# Patient Record
Sex: Female | Born: 1950 | Race: Black or African American | Hispanic: No | Marital: Married | Smoking: Never smoker
Health system: Southern US, Community
[De-identification: ages and names within clinical notes are randomized; demographics above are authoritative.]

## PROBLEM LIST (undated history)

## (undated) DIAGNOSIS — E7800 Pure hypercholesterolemia, unspecified: Secondary | ICD-10-CM

## (undated) DIAGNOSIS — I10 Essential (primary) hypertension: Secondary | ICD-10-CM

## (undated) DIAGNOSIS — R7303 Prediabetes: Secondary | ICD-10-CM

## (undated) HISTORY — PX: OTHER SURGICAL HISTORY: SHX169

---

## 2014-07-15 ENCOUNTER — Encounter: Attending: Family Medicine | Primary: Family Medicine

## 2014-07-27 ENCOUNTER — Encounter: Attending: Family Medicine | Primary: Family Medicine

## 2014-08-05 ENCOUNTER — Ambulatory Visit
Admit: 2014-08-05 | Discharge: 2014-08-05 | Payer: PRIVATE HEALTH INSURANCE | Attending: Family Medicine | Primary: Family Medicine

## 2014-08-05 DIAGNOSIS — I10 Essential (primary) hypertension: Secondary | ICD-10-CM

## 2014-08-05 MED ORDER — LISINOPRIL 5 MG TAB
5 mg | ORAL_TABLET | Freq: Every day | ORAL | Status: DC
Start: 2014-08-05 — End: 2014-10-19

## 2014-08-05 MED ORDER — TRIAMCINOLONE ACETONIDE 0.1 % TOPICAL CREAM
0.1 % | Freq: Two times a day (BID) | CUTANEOUS | Status: AC
Start: 2014-08-05 — End: ?

## 2014-08-05 MED ORDER — ATENOLOL 25 MG TAB
25 mg | ORAL_TABLET | Freq: Every day | ORAL | Status: DC
Start: 2014-08-05 — End: 2014-10-19

## 2014-08-05 NOTE — Progress Notes (Signed)
1. Have you been to the ER, urgent care clinic since your last visit?  Hospitalized since your last visit?No    2. Have you seen or consulted any other health care providers outside of the [facility] since your last visit?  Include any pap smears or colon screening. No    Patient id verified x 3.     Chief Complaint   Patient presents with   ??? New Patient     est pcp    ??? Head Pain     [facility] and [facility] were going [facility] results but nothing yet    ??? Medication Refill     patient needs refill on bp medication and having spiked bp    ??? Skin Problem     patient reporting right hand itchs and burns. per patient burning is worse when touching water.

## 2014-08-05 NOTE — Progress Notes (Signed)
1. Have you been to the ER, urgent care clinic since your last visit?  Hospitalized since your last visit?No    2. Have you seen or consulted any other health care providers outside of the [facility] since your last visit?  Include any pap smears or colon screening. No    Patient id verified x 3.     Chief Complaint   Patient presents with   ??? New Patient     est pcp    ??? Head Pain     [facility] results but nothing yet    ??? Medication Refill     patient needs refill on bp medication and having spiked bp    ??? Skin Problem     patient reporting right hand itchs and burns. per patient burning is worse when touching water.        Chief Complaint   Patient presents with   ??? New Patient     est pcp    ??? Head Pain     [facility] but nothing yet    ??? Medication Refill     patient needs refill on bp medication and having spiked bp    ??? Skin Problem     patient reporting right hand itchs and burns. per patient burning is worse when touching water.      she is a 64 y.o. year old female who presents for evalution.    Reviewed PmHx, RxHx, FmHx, SocHx, AllgHx and updated and dated in the chart.    Nurse notes were reviewed and copied in this note.    Patient Active Problem List    Diagnosis   ??? Essential hypertension       Nurse notes were reviewed and copied and are correct  Review of Systems - negative except as listed above in the HPI    Objective:     Filed Vitals:    08/05/14 1010   BP: 127/81   Pulse: 50   Temp: 98.1 ??F (36.7 ??C)   TempSrc: Oral   Resp: 18   Weight: 147 lb (66.679 kg)   SpO2: 98%     Physical Examination: General appearance - alert, well appearing, and in no distress  Eyes - pupils equal and reactive, extraocular eye movements intact  Ears - bilateral TM's and external ear canals normal  Nose - normal and patent, no erythema, discharge or polyps   Mouth - mucous membranes moist, pharynx normal without lesions  Neck - supple, no significant adenopathy  Chest - clear to auscultation, no wheezes, rales or rhonchi, symmetric air entry  Heart - normal rate, regular rhythm, normal S1, S2, no murmurs, rubs, clicks or gallops  Abdomen - soft, nontender, nondistended, no masses or organomegaly  Extremities - R hand with red dry skin, no dc, no cracks, nt        Assessment/ Plan:   Ainsleigh was seen today for new patient, head pain, medication refill and skin problem.    Diagnoses and all orders for this visit:    Essential hypertension  Orders:  -     lisinopril (PRINIVIL, ZESTRIL) 5 mg tablet; Take 1 Tab by mouth daily.  -     atenolol (TENORMIN) 25 mg tablet; Take 1 Tab by mouth daily.  -at goal    Rash  Orders:  -     triamcinolone  acetonide (KENALOG) 0.1 % topical cream; Apply  to affected area two (2) times a day.  -add rx  -dec water contact      Other complicated headache syndrome  -will look for CT scan from [facility]       Follow-up Disposition:  Return in about 6 months (around 02/04/2015).    I have discussed the diagnosis with the patient and the intended plan as seen in the above orders.  The patient understands and agrees with the plan. The patient has received an after-visit summary and questions were answered concerning future plans.     Medication Side Effects and Warnings were discussed with patient: yes  Patient Labs were reviewed and or requested: yes    Patient Past Records were reviewed and or requested: yes    Danley Danker, M.D.    There are no Patient Instructions on file for this visit.

## 2014-10-19 ENCOUNTER — Ambulatory Visit
Admit: 2014-10-19 | Discharge: 2014-10-19 | Payer: PRIVATE HEALTH INSURANCE | Attending: Family Medicine | Primary: Family Medicine

## 2014-10-19 DIAGNOSIS — I10 Essential (primary) hypertension: Secondary | ICD-10-CM

## 2014-10-19 MED ORDER — ATENOLOL 25 MG TAB
25 mg | ORAL_TABLET | Freq: Every day | ORAL | Status: DC
Start: 2014-10-19 — End: 2014-12-08

## 2014-10-19 MED ORDER — LISINOPRIL 5 MG TAB
5 mg | ORAL_TABLET | Freq: Every day | ORAL | Status: DC
Start: 2014-10-19 — End: 2014-12-08

## 2014-10-19 NOTE — Progress Notes (Signed)
1. Have you been to the ER, urgent care clinic since your last visit?  Hospitalized since your last visit?No    2. Have you seen or consulted any other health care providers outside of the [facility] since your last visit?  Include any pap smears or colon screening. No    Patient id verified x 3.     Chief Complaint   Patient presents with   ??? Ear Pain     pain and itching to both ears since 3 weeks ago    ??? Medication Refill   ??? Results     needs to [facility] at [facility] [place].    ??? Labs     patient had eyes checked and per eye doctor her pcp should address cholesterol.

## 2014-10-19 NOTE — Progress Notes (Signed)
1. Have you been to the ER, urgent care clinic since your last visit?  Hospitalized since your last visit?No    2. Have you seen or consulted any other health care providers outside of the [facility] since your last visit?  Include any pap smears or colon screening. No    Patient id verified x 3.     Chief Complaint   Patient presents with   ??? Ear Pain     pain and itching to both ears since 3 weeks ago    ??? Medication Refill   ??? Results     needs to [facility] at [facility] [place].    ??? Labs     patient had eyes checked and per eye doctor her pcp should address cholesterol.      Chief Complaint   Patient presents with   ??? Ear Pain     pain and itching to both ears since 3 weeks ago    ??? Medication Refill   ??? Results     needs to [facility] at [facility] [place].    ??? Labs     patient had eyes checked and per eye doctor her pcp should address cholesterol.      she is a 64 y.o. year old female who presents for evalution.    Reviewed PmHx, RxHx, FmHx, SocHx, AllgHx and updated and dated in the chart.    Nurse notes were reviewed and copied in this note.    Patient Active Problem List    Diagnosis   ??? Essential hypertension       Nurse notes were reviewed and copied and are correct  Review of Systems - negative except as listed above in the HPI    Objective:     Filed Vitals:    10/19/14 1031   BP: 157/88   Pulse: 56   Temp: 98 ??F (36.7 ??C)   TempSrc: Oral   Resp: 16   Height: 5\' 3"  (1.6 m)   Weight: 151 lb (68.493 kg)   SpO2: 98%     Physical Examination: General appearance - alert, well appearing, and in no distress  Eyes - pupils equal and reactive, extraocular eye movements intact  Ears - bilateral TM's and external ear canals normal  Nose - normal and patent, no erythema, discharge or polyps  Neck - supple, no significant adenopathy  Chest - clear to auscultation, no wheezes, rales or rhonchi, symmetric air entry  Heart - normal rate, regular rhythm, normal S1, S2, no murmurs, rubs,  clicks or gallops    Assessment/ Plan:   Javionna was seen today for ear pain, medication refill, results and labs.    Diagnoses and all orders for this visit:    Essential hypertension with goal blood pressure less than 140/90  Orders:  -     lisinopril (PRINIVIL, ZESTRIL) 5 mg tablet; Take 1 Tab by mouth daily.  -     atenolol (TENORMIN) 25 mg tablet; Take 1 Tab by mouth daily.  -     LIPID PANEL  -     METABOLIC PANEL, COMPREHENSIVE  -better at home       Follow-up Disposition:  Return in about 6 months (around 04/20/2015).    I have discussed the diagnosis with the patient and the intended plan as seen in the above orders.  The patient understands and agrees with the plan. The patient has received an after-visit summary and questions  were answered concerning future plans.     Medication Side Effects and Warnings were discussed with patient: yes  Patient Labs were reviewed and or requested: yes  Patient Past Records were reviewed and or requested: yes    Cheri Rous, M.D.    There are no Patient Instructions on file for this visit.

## 2014-10-20 LAB — METABOLIC PANEL, COMPREHENSIVE
A-G Ratio: 1.5 (ref 1.1–2.5)
ALT (SGPT): 10 IU/L (ref 0–32)
AST (SGOT): 13 IU/L (ref 0–40)
Albumin: 4.2 g/dL (ref 3.6–4.8)
Alk. phosphatase: 113 IU/L (ref 39–117)
BUN/Creatinine ratio: 20 (ref 11–26)
BUN: 13 mg/dL (ref 8–27)
Bilirubin, total: 0.4 mg/dL (ref 0.0–1.2)
CO2: 27 mmol/L (ref 18–29)
Calcium: 9.3 mg/dL (ref 8.7–10.3)
Chloride: 105 mmol/L (ref 97–108)
Creatinine: 0.64 mg/dL (ref 0.57–1.00)
GFR est AA: 110 mL/min/{1.73_m2} (ref >59)
GFR est non-AA: 95 mL/min/{1.73_m2} (ref >59)
GLOBULIN, TOTAL: 2.8 g/dL (ref 1.5–4.5)
Glucose: 85 mg/dL (ref 65–99)
Potassium: 4.3 mmol/L (ref 3.5–5.2)
Protein, total: 7 g/dL (ref 6.0–8.5)
Sodium: 145 mmol/L — ABNORMAL HIGH (ref 134–144)

## 2014-10-20 LAB — LIPID PANEL
Cholesterol, total: 168 mg/dL (ref 100–199)
HDL Cholesterol: 33 mg/dL — ABNORMAL LOW (ref >39)
LDL, calculated: 121 mg/dL — ABNORMAL HIGH (ref 0–99)
Triglyceride: 70 mg/dL (ref 0–149)
VLDL, calculated: 14 mg/dL (ref 5–40)

## 2014-10-20 LAB — CVD REPORT

## 2014-10-20 NOTE — Progress Notes (Signed)
Quick Note:        Your labs look good! I suggest we continue with your current plan of treatment and follow-up as we have discussed. If you have any questions, do not hesitate to send me a message.        New for 2016...you will be receiving a patient satisfaction survey from [place] and our office. I appreciate if you would take a few moments to complete the form. A score of 5 is what we strive for, and while we are not perfect...we are always working our hardest for you!!            Dr. Fadia Marlar, M.D.            Good Help to those in Need!!!                ______

## 2014-10-20 NOTE — Progress Notes (Signed)
Quick Note:        Letter sent        ______

## 2014-11-15 NOTE — Progress Notes (Signed)
Pt stats she wants her cd from [facility]. Let her know she has a wk to pick up.

## 2014-12-08 ENCOUNTER — Encounter

## 2014-12-08 MED ORDER — LISINOPRIL 5 MG TAB
5 mg | ORAL_TABLET | Freq: Every day | ORAL | Status: AC
Start: 2014-12-08 — End: ?

## 2014-12-08 MED ORDER — ATENOLOL 25 MG TAB
25 mg | ORAL_TABLET | Freq: Every day | ORAL | Status: AC
Start: 2014-12-08 — End: ?

## 2014-12-20 NOTE — Progress Notes (Signed)
Pt had a week to pick up disc from 07.18.16 and never picked going to shred disc. 08.22.16

## 2020-05-11 ENCOUNTER — Other Ambulatory Visit: Payer: Self-pay

## 2020-05-11 ENCOUNTER — Other Ambulatory Visit (HOSPITAL_COMMUNITY)
Admission: RE | Admit: 2020-05-11 | Discharge: 2020-05-11 | Disposition: A | Discharge status: Home / Self Care | Payer: Medicare Other | Source: Ambulatory Visit | Attending: Obstetrics & Gynecology | Admitting: Obstetrics & Gynecology

## 2020-05-11 ENCOUNTER — Encounter: Payer: Self-pay | Admitting: Obstetrics & Gynecology

## 2020-05-11 ENCOUNTER — Ambulatory Visit (INDEPENDENT_AMBULATORY_CARE_PROVIDER_SITE_OTHER): Payer: Medicare Other | Admitting: Obstetrics & Gynecology

## 2020-05-11 VITALS — BP 128/68 | HR 71 | Ht 64.0 in | Wt 167.0 lb

## 2020-05-11 DIAGNOSIS — Z1231 Encounter for screening mammogram for malignant neoplasm of breast: Secondary | ICD-10-CM | POA: Diagnosis not present

## 2020-05-11 DIAGNOSIS — Z01419 Encounter for gynecological examination (general) (routine) without abnormal findings: Secondary | ICD-10-CM | POA: Diagnosis not present

## 2020-05-11 DIAGNOSIS — Z1151 Encounter for screening for human papillomavirus (HPV): Secondary | ICD-10-CM | POA: Diagnosis not present

## 2020-05-11 NOTE — Progress Notes (Signed)
Subjective:     Destiny Cannon is a 70 y.o. female here for a routine exam.  Current complaints: Pt has a family history of breast cancer in 2 sisters in their 76's. She is faithful in getting her mammograms annually. She denies current GYN problems but, she wants to make sure that she gets a breast exam. Pt is s/p menopause 15 years prev. She is s/p SVD x4.  Her daughter is a pt of ours and referred her to our ofc.      Gynecologic History No LMP recorded. Patient is postmenopausal. Contraception: post menopausal status Last Pap: unk Last mammogram: 07/07/2019. Results were: [facility] at [facility].   Obstetric History OB History  Gravida Para Term Preterm AB Living  4 4 3     4   SAB IAB Ectopic Multiple Live Births          4    # Outcome Date GA Lbr Len/2nd Weight Sex Delivery Anes PTL Lv  4 Term 35 [redacted]w[redacted]d   M Vag-Spont None N LIV  3 Para 1995 [redacted]w[redacted]d   F Vag-Spont None N LIV  2 Term 73 [redacted]w[redacted]d   M Vag-Spont None Y LIV  1 Term 1977 [redacted]w[redacted]d   M Vag-Spont None N LIV   The following portions of the patient's history were reviewed and updated as appropriate: allergies, current medications, past family history, past medical history, past social history, past surgical history and problem list.  Review of Systems Pertinent items are noted in HPI.    Objective:  BP 128/68   Pulse 71   Ht 5\' 4"  (1.626 m)   Wt 167 lb (75.8 kg)   BMI 28.67 kg/m  General Appearance:    Alert, cooperative, no distress, appears stated age  Head:    Normocephalic, without obvious abnormality, atraumatic  Eyes:    conjunctiva/corneas clear, EOM's intact, both eyes  Ears:    Normal external ear canals, both ears  Nose:   Nares normal, septum midline, mucosa normal, no drainage    or sinus tenderness  Throat:   Lips, mucosa, and tongue normal; teeth and gums normal  Neck:   Supple, symmetrical, trachea midline, no adenopathy;    thyroid:  no enlargement/tenderness/nodules  Back:     Symmetric, no curvature, ROM  normal, no CVA tenderness  Lungs:     respirations unlabored  Chest Wall:    No tenderness or deformity   Heart:    Regular rate and rhythm  Breast Exam:    No tenderness, masses, or nipple abnormality. Well healed incision right breast. NO axillary masses palpated.    Abdomen:     Soft, non-tender, bowel sounds active all four quadrants,    no masses, no organomegaly  Genitalia:    Normal female without lesion, discharge or tenderness     Extremities:   Extremities normal, atraumatic, no cyanosis or edema  Pulses:   2+ and symmetric all extremities  Skin:   Skin color, texture, turgor normal, no rashes or lesions    Assessment:    Healthy female exam.   Family history of breast cancer. Pt has not had genetic testing. Does not feel its needed.  Pt UTD on BMD and colonoscopy     Plan:    f/u Mammogram in Mar F/u in 1 year for annual  F/u for PAP with hrHPV  Destiny Cannon L. Harraway-Smith, M.D., 

## 2020-05-13 LAB — CYTOLOGY - PAP
Comment: NEGATIVE
Diagnosis: NEGATIVE
High risk HPV: NEGATIVE

## 2020-05-17 ENCOUNTER — Emergency Department (HOSPITAL_BASED_OUTPATIENT_CLINIC_OR_DEPARTMENT_OTHER): Payer: Medicare Other

## 2020-05-17 ENCOUNTER — Encounter (HOSPITAL_BASED_OUTPATIENT_CLINIC_OR_DEPARTMENT_OTHER): Payer: Self-pay

## 2020-05-17 ENCOUNTER — Other Ambulatory Visit: Payer: Self-pay

## 2020-05-17 ENCOUNTER — Emergency Department (HOSPITAL_BASED_OUTPATIENT_CLINIC_OR_DEPARTMENT_OTHER)
Admission: EM | Admit: 2020-05-17 | Discharge: 2020-05-17 | Disposition: A | Discharge status: Home / Self Care | Payer: Medicare Other | Attending: Emergency Medicine | Admitting: Emergency Medicine

## 2020-05-17 DIAGNOSIS — U071 COVID-19: Secondary | ICD-10-CM

## 2020-05-17 DIAGNOSIS — N3000 Acute cystitis without hematuria: Secondary | ICD-10-CM

## 2020-05-17 DIAGNOSIS — Z79899 Other long term (current) drug therapy: Secondary | ICD-10-CM | POA: Insufficient documentation

## 2020-05-17 DIAGNOSIS — R109 Unspecified abdominal pain: Secondary | ICD-10-CM | POA: Diagnosis present

## 2020-05-17 DIAGNOSIS — I10 Essential (primary) hypertension: Secondary | ICD-10-CM | POA: Diagnosis not present

## 2020-05-17 HISTORY — DX: Pure hypercholesterolemia, unspecified: E78.00

## 2020-05-17 HISTORY — DX: Essential (primary) hypertension: I10

## 2020-05-17 LAB — URINALYSIS, MICROSCOPIC (REFLEX): WBC, UA: >50 WBC/hpf (ref 0–5)

## 2020-05-17 LAB — CBC
HCT: 44.2 % (ref 36.0–46.0)
Hemoglobin: 14.6 g/dL (ref 12.0–15.0)
MCH: 29.5 pg (ref 26.0–34.0)
MCHC: 33 g/dL (ref 30.0–36.0)
MCV: 89.3 fL (ref 80.0–100.0)
Platelets: 246 10*3/uL (ref 150–400)
RBC: 4.95 MIL/uL (ref 3.87–5.11)
RDW: 14 % (ref 11.5–15.5)
WBC: 9.6 10*3/uL (ref 4.0–10.5)
nRBC: 0 % (ref 0.0–0.2)

## 2020-05-17 LAB — BASIC METABOLIC PANEL
Anion gap: 11 (ref 5–15)
BUN: 12 mg/dL (ref 8–23)
CO2: 27 mmol/L (ref 22–32)
Calcium: 9.1 mg/dL (ref 8.9–10.3)
Chloride: 97 mmol/L — ABNORMAL LOW (ref 98–111)
Creatinine, Ser: 0.69 mg/dL (ref 0.44–1.00)
GFR, Estimated: >60 mL/min (ref >60)
Glucose, Bld: 115 mg/dL — ABNORMAL HIGH (ref 70–99)
Potassium: 3.8 mmol/L (ref 3.5–5.1)
Sodium: 135 mmol/L (ref 135–145)

## 2020-05-17 LAB — URINALYSIS, ROUTINE W REFLEX MICROSCOPIC
Bilirubin Urine: NEGATIVE
Glucose, UA: NEGATIVE mg/dL
Ketones, ur: NEGATIVE mg/dL
Nitrite: NEGATIVE
Protein, ur: NEGATIVE mg/dL
Specific Gravity, Urine: 1.01 (ref 1.005–1.030)
pH: 7 (ref 5.0–8.0)

## 2020-05-17 MED ORDER — SODIUM CHLORIDE 0.9 % IV SOLN
1.0000 g | Freq: Once | INTRAVENOUS | Status: AC
  Administered 2020-05-17: 1 g via INTRAVENOUS
  Filled 2020-05-17: qty 10

## 2020-05-17 MED ORDER — CEPHALEXIN 500 MG PO CAPS: 500.0000 mg | ORAL_CAPSULE | Freq: Four times a day (QID) | ORAL | 0 refills | Status: DC

## 2020-05-17 NOTE — ED Triage Notes (Signed)
Pt c/o bilat flank pain started 1/13-also dx with +covid 1/13-NAD-steady gait

## 2020-05-17 NOTE — Discharge Instructions (Signed)
Starting tomorrow take the antibiotic Keflex as directed for the next 7 days.  Work-up seems to be consistent with urinary tract infection.  IV antibiotics given here tonight.  Would expect you to improve over the next 2 days.  Follow-up with your doctor or return here for any new or worse symptoms.

## 2020-05-17 NOTE — ED Provider Notes (Signed)
[facility] EMERGENCY DEPARTMENT Provider Note   CSN: [id number] Arrival date & time: 05/17/20  1422     History Chief Complaint  Patient presents with  . Flank Pain    +covid    Destiny Cannon is a 70 y.o. female.  Patient with a complaint of bilateral flank pain since January 13.  She also was diagnosed with COVID at that time.  No real classic COVID symptoms no cough no body aches no shortness of breath.  It does hurt occasionally to pee.  No nausea vomiting or diarrhea no fevers.  Patient is fully vaccinated.  Along with booster.  Past medical history significant for hypertension high cholesterol.        Past Medical History:  Diagnosis Date  . High cholesterol   . Hypertension     There are no problems to display for this patient.   Past Surgical History:  Procedure Laterality Date  . right breast biopsy        OB History    Gravida  4   Para  4   Term  3   Preterm      AB      Living  4     SAB      IAB      Ectopic      Multiple      Live Births  4           Family History  Problem Relation Age of Onset  . Colon cancer Mother     Social History   Tobacco Use  . Smoking status: Never Smoker  . Smokeless tobacco: Never Used  Substance Use Topics  . Alcohol use: Never  . Drug use: Never    Home Medications Prior to Admission medications   Medication Sig Start Date End Date Taking? Authorizing Provider  cephALEXin (KEFLEX) 500 MG capsule Take 1 capsule (500 mg total) by mouth 4 (four) times daily. 05/17/20  Yes Vanetta Mulders, MD  Ascorbic Acid (VITAMIN C WITH ROSE HIPS) 500 MG tablet Take 500 mg by mouth daily.    [provider]  aspirin EC 81 MG tablet Take 81 mg by mouth daily. Swallow whole.    [provider]  atorvastatin (LIPITOR) 10 MG tablet Take 10 mg by mouth daily.    [provider]  cholecalciferol (VITAMIN D3) 25 MCG (1000 UNIT) tablet Take 1,000 Units by mouth daily.     [provider]  lisinopril-hydrochlorothiazide (ZESTORETIC) 20-12.5 MG tablet Take 1 tablet by mouth daily.    [provider]    Allergies    Patient has no known allergies.  Review of Systems   Review of Systems  Constitutional: Negative for chills and fever.  HENT: Negative for congestion, rhinorrhea and sore throat.   Eyes: Negative for visual disturbance.  Respiratory: Negative for cough and shortness of breath.   Cardiovascular: Negative for chest pain and leg swelling.  Gastrointestinal: Negative for abdominal pain, diarrhea, nausea and vomiting.  Genitourinary: Positive for dysuria and flank pain.  Musculoskeletal: Negative for back pain and neck pain.  Skin: Negative for rash.  Neurological: Negative for dizziness, light-headedness and headaches.  Hematological: Does not bruise/bleed easily.  Psychiatric/Behavioral: Negative for confusion.    Physical Exam Updated Vital Signs BP (!) 141/107 (BP Location: Right Arm)   Pulse (!) 55   Temp 98.5 F (36.9 C) (Oral)   Resp 14   Ht 1.6 m (5\' 3" )   Wt 71.7  kg   SpO2 100%   BMI 27.99 kg/m   Physical Exam Vitals and nursing note reviewed.  Constitutional:      General: She is not in acute distress.    Appearance: Normal appearance. She is well-developed and well-nourished. She is not ill-appearing.  HENT:     Head: Normocephalic and atraumatic.  Eyes:     Extraocular Movements: Extraocular movements intact.     Conjunctiva/sclera: Conjunctivae normal.     Pupils: Pupils are equal, round, and reactive to light.  Cardiovascular:     Rate and Rhythm: Normal rate and regular rhythm.     Heart sounds: No murmur heard.   Pulmonary:     Effort: Pulmonary effort is normal. No respiratory distress.     Breath sounds: Normal breath sounds.  Abdominal:     Palpations: Abdomen is soft.     Tenderness: There is no abdominal tenderness.  Musculoskeletal:        General: No edema. Normal range of motion.      Cervical back: Neck supple.  Skin:    General: Skin is warm and dry.  Neurological:     General: No focal deficit present.     Mental Status: She is alert and oriented to person, place, and time.     Cranial Nerves: No cranial nerve deficit.     Sensory: No sensory deficit.     Motor: No weakness.  Psychiatric:        Mood and Affect: Mood and affect normal.     ED Results / Procedures / Treatments   Labs (all labs ordered are listed, but only abnormal results are displayed) Labs Reviewed  BASIC METABOLIC PANEL - Abnormal; Notable for the following components:      Result Value   Chloride 97 (*)    Glucose, Bld 115 (*)    All other components within normal limits  URINALYSIS, ROUTINE W REFLEX MICROSCOPIC - Abnormal; Notable for the following components:   APPearance HAZY (*)    Hgb urine dipstick SMALL (*)    Leukocytes,Ua LARGE (*)    All other components within normal limits  URINALYSIS, MICROSCOPIC (REFLEX) - Abnormal; Notable for the following components:   Bacteria, UA MANY (*)    All other components within normal limits  URINE CULTURE  CBC    EKG None  Radiology CT Renal Stone Study  Result Date: 05/17/2020 CLINICAL DATA:  Bilateral flank pain, COVID positive EXAM: CT ABDOMEN AND PELVIS WITHOUT CONTRAST TECHNIQUE: Multidetector CT imaging of the abdomen and pelvis was performed following the standard protocol without IV contrast. COMPARISON:  None. FINDINGS: Lower chest: The visualized heart size within normal limits. No pericardial fluid/thickening. No hiatal hernia. The visualized portions of the lungs are clear. Hepatobiliary: Although limited due to the lack of intravenous contrast, normal in appearance without gross focal abnormality. No evidence of calcified gallstones or biliary ductal dilatation. Pancreas:  Unremarkable.  No surrounding inflammatory changes. Spleen: Normal in size. Although limited due to the lack of intravenous contrast, normal in  appearance. Adrenals/Urinary Tract: Both adrenal glands appear normal. There is mild right proximal periureteral stranding changes down to the level of the distal ureter. No renal or collecting system calculi are seen. There appears to be mild superior bladder wall thickening. Stomach/Bowel: The stomach, small bowel, and colon are normal in appearance. No inflammatory changes or obstructive findings. appendix is normal. Vascular/Lymphatic: There are no enlarged abdominal or pelvic lymph nodes. Scattered aortic atherosclerotic calcifications are seen without  aneurysmal dilatation. Reproductive: Within the left adnexa there is a 3.2 cm low-density lesion, likely ovarian cyst. Other: No evidence of abdominal wall mass or hernia. Musculoskeletal: No acute or significant osseous findings. IMPRESSION: Mild right periureteral inflammatory changes which could be due to ureteritis. Findings suggestive of mild cystitis 3.2 cm left probable ovarian cyst Aortic Atherosclerosis (ICD10-I70.0). Electronically Signed   By: Jonna Clark M.D.   On: 05/17/2020 20:42    Procedures Procedures (including critical care time)  Medications Ordered in ED Medications  cefTRIAXone (ROCEPHIN) 1 g in sodium chloride 0.9 % 100 mL IVPB (0 g Intravenous Stopped 05/17/20 1940)    ED Course  I have reviewed the triage vital signs and the nursing notes.  Pertinent labs & imaging results that were available during my care of the patient were reviewed by me and considered in my medical decision making (see chart for details).    MDM Rules/Calculators/A&P                         Initial part of work-up showed her known urinalysis highly suggestive of urinary tract infection.  Urine culture sent.  Because of the bilateral flank pain wanted to do CT renal studies.  Patient initially refused.  But after she received her IV Rocephin and had something to eat she agreed to do the CT scan.  So we proceeded with that.  Shows a little bit of  inflammation in the lower part of the ureters.  But no evidence of pyelonephritis.  Patient is testing positive for COVID as per her test on January 13.  But not really showing any symptoms.  At least any classic symptoms.  No hematuria.  Urine sent for culture given IV Rocephin will be continued on oral Keflex.  She will return if there is no improvement in 2 days or follow-up with her doctor.  Final Clinical Impression(s) / ED Diagnoses Final diagnoses:  COVID  Acute cystitis without hematuria    Rx / DC Orders ED Discharge Orders         Ordered    cephALEXin (KEFLEX) 500 MG capsule  4 times daily        05/17/20 2106           Vanetta Mulders, MD 05/17/20 2113

## 2020-05-18 ENCOUNTER — Telehealth: Payer: Self-pay | Admitting: General Practice

## 2020-05-18 NOTE — Telephone Encounter (Signed)
Called patient to discuss scheduling Mammogram.  Pt stated that she wasn't due for a one not until March and that she will give [place] a call around that time.

## 2020-05-20 LAB — URINE CULTURE: Culture: >=100000 — AB

## 2020-09-01 ENCOUNTER — Ambulatory Visit (HOSPITAL_BASED_OUTPATIENT_CLINIC_OR_DEPARTMENT_OTHER)
Admission: RE | Admit: 2020-09-01 | Discharge: 2020-09-01 | Disposition: A | Discharge status: Home / Self Care | Payer: Medicare Other | Source: Ambulatory Visit | Attending: Obstetrics & Gynecology | Admitting: Obstetrics & Gynecology

## 2020-09-01 ENCOUNTER — Encounter (HOSPITAL_BASED_OUTPATIENT_CLINIC_OR_DEPARTMENT_OTHER): Payer: Self-pay | Admitting: Radiology

## 2020-09-01 ENCOUNTER — Other Ambulatory Visit: Payer: Self-pay

## 2020-09-01 DIAGNOSIS — Z1231 Encounter for screening mammogram for malignant neoplasm of breast: Secondary | ICD-10-CM | POA: Insufficient documentation

## 2021-10-25 ENCOUNTER — Encounter: Payer: Self-pay | Admitting: Obstetrics & Gynecology

## 2021-10-25 ENCOUNTER — Other Ambulatory Visit (HOSPITAL_COMMUNITY)
Admission: RE | Admit: 2021-10-25 | Discharge: 2021-10-25 | Disposition: A | Discharge status: Home / Self Care | Payer: Medicare Other | Source: Ambulatory Visit | Attending: Obstetrics & Gynecology | Admitting: Obstetrics & Gynecology

## 2021-10-25 ENCOUNTER — Encounter: Payer: Self-pay | Admitting: General Practice

## 2021-10-25 ENCOUNTER — Ambulatory Visit (INDEPENDENT_AMBULATORY_CARE_PROVIDER_SITE_OTHER): Payer: Medicare Other | Admitting: Obstetrics & Gynecology

## 2021-10-25 VITALS — BP 117/69 | HR 87 | Wt 176.0 lb

## 2021-10-25 DIAGNOSIS — Z1231 Encounter for screening mammogram for malignant neoplasm of breast: Secondary | ICD-10-CM

## 2021-10-25 DIAGNOSIS — Z13820 Encounter for screening for osteoporosis: Secondary | ICD-10-CM

## 2021-10-25 DIAGNOSIS — N924 Excessive bleeding in the premenopausal period: Secondary | ICD-10-CM | POA: Diagnosis not present

## 2021-10-25 DIAGNOSIS — Z01419 Encounter for gynecological examination (general) (routine) without abnormal findings: Secondary | ICD-10-CM | POA: Insufficient documentation

## 2021-10-25 DIAGNOSIS — Z1151 Encounter for screening for human papillomavirus (HPV): Secondary | ICD-10-CM | POA: Insufficient documentation

## 2021-10-25 DIAGNOSIS — R87610 Atypical squamous cells of undetermined significance on cytologic smear of cervix (ASC-US): Secondary | ICD-10-CM | POA: Diagnosis not present

## 2021-10-25 DIAGNOSIS — N858 Other specified noninflammatory disorders of uterus: Secondary | ICD-10-CM | POA: Diagnosis not present

## 2021-10-25 DIAGNOSIS — N950 Postmenopausal bleeding: Secondary | ICD-10-CM | POA: Diagnosis not present

## 2021-10-25 NOTE — Progress Notes (Signed)
Subjective:     Destiny Cannon is a 71 y.o. female here for a routine exam.  Current complaints: Pt had 2 episodes of bleeding (last week and today). No assoc pain. She was not sure it there was something on the outside as she's on ASA and bleeds a lot for any reason if cut etc. She thinks she may have scratched/hurt herself in wiping or something. Today she says it was 'like a period'. She denies any fever, chills or weight loss. She goes to the gym.        Gynecologic History No LMP recorded. Patient is postmenopausal. Contraception: post menopausal status Last Pap: 05/11/2020. Results were: normal Last mammogram: 09/01/2020. Results were: normal  Obstetric History OB History  Gravida Para Term Preterm AB Living  4 4 3     4   SAB IAB Ectopic Multiple Live Births          4    # Outcome Date GA Lbr Len/2nd Weight Sex Delivery Anes PTL Lv  4 Term 8 [redacted]w[redacted]d   M Vag-Spont None N LIV  3 Para 1995 [redacted]w[redacted]d   F Vag-Spont None N LIV  2 Term 22 [redacted]w[redacted]d   M Vag-Spont None Y LIV  1 Term 1977 [redacted]w[redacted]d   M Vag-Spont None N LIV   The following portions of the patient's history were reviewed and updated as appropriate: allergies, current medications, past family history, past medical history, past social history, past surgical history, and problem list.  Review of Systems Pertinent items are noted in HPI.    Objective:  BP 117/69   Pulse 87   Wt 176 lb (79.8 kg)   BMI 31.18 kg/m   General Appearance:    Alert, cooperative, no distress, appears stated age  Head:    Normocephalic, without obvious abnormality, atraumatic  Eyes:    conjunctiva/corneas clear, EOM's intact, both eyes  Ears:    Normal external ear canals, both ears  Nose:   Nares normal, septum midline, mucosa normal, no drainage    or sinus tenderness  Throat:   Lips, mucosa, and tongue normal; teeth and gums normal  Neck:   Supple, symmetrical, trachea midline, no adenopathy;    thyroid:  no enlargement/tenderness/nodules  Back:      Symmetric, no curvature, ROM normal, no CVA tenderness  Lungs:     respirations unlabored  Chest Wall:    No tenderness or deformity   Heart:    Regular rate and rhythm  Breast Exam:    No tenderness, masses, or nipple abnormality  Abdomen:     Soft, non-tender, bowel sounds active all four quadrants,    no masses, no organomegaly  Genitalia:    Normal female without lesion, discharge or tenderness   There is a very small comedone on the right labia. It is not bleeding. There is no blood in the vault. The cervix looks sl patent. The uterus is enlarged to 12 weeks size.    Extremities:   Extremities normal, atraumatic, no cyanosis or edema  Pulses:   2+ and symmetric all extremities  Skin:   Skin color, texture, turgor normal, no rashes or lesions    GYN procedure: The indications for endometrial biopsy were reviewed.   Risks of the biopsy including cramping, bleeding, infection, uterine perforation, inadequate specimen and need for additional procedures  were discussed. The patient states she understands and agrees to undergo procedure today. Consent was signed. Time out was performed. Urine HCG was negative. A sterile speculum  was placed in the patient's vagina and the cervix was prepped with Betadine. A single-toothed tenaculum was placed on the anterior lip of the cervix to stabilize it. The 3 mm pipelle was introduced into the endometrial cavity without difficulty to a depth of 9cm, and a small amount of tissue was obtained and sent to pathology. There was also a fair amount of clear fluid. The instruments were removed from the patient's vagina. Minimal bleeding from the cervix was noted. The patient tolerated the procedure well. Routine post-procedure instructions were given to the patient. The patient will follow up to review the results and for further management.    Assessment:    Healthy female exam.  PMPB- it is possible that the comedone, if scratched, could have led to the bleeding.  However, in discussion with the pt, we will do a full workup for PMPB as this occurred 2x and was heavy.     Plan:   Destiny Cannon was seen today for annual exam.  Diagnoses and all orders for this visit:  Well female exam with routine gynecological exam -     Cytology - PAP( [place])  Menopausal bleeding -     Surgical pathology -     US PELVIS TRANSVAGINAL NON-OB (TV ONLY); Future  Osteoporosis screening -     DG DXA FRACTURE ASSESSMENT; Future  Breast cancer screening by mammogram -     MM 3D SCREEN BREAST BILATERAL; Future   F/u 4 weeks or sooner prn  F/u in 1 year for annual.   Morganne Haile L. Harraway-Smith, M.D., [facility]

## 2021-10-27 LAB — SURGICAL PATHOLOGY

## 2021-10-30 ENCOUNTER — Ambulatory Visit (HOSPITAL_BASED_OUTPATIENT_CLINIC_OR_DEPARTMENT_OTHER)
Admission: RE | Admit: 2021-10-30 | Discharge: 2021-10-30 | Disposition: A | Discharge status: Home / Self Care | Payer: Medicare Other | Source: Ambulatory Visit | Attending: Obstetrics & Gynecology | Admitting: Obstetrics & Gynecology

## 2021-10-30 ENCOUNTER — Other Ambulatory Visit: Payer: Self-pay | Admitting: Obstetrics & Gynecology

## 2021-10-30 ENCOUNTER — Encounter (HOSPITAL_BASED_OUTPATIENT_CLINIC_OR_DEPARTMENT_OTHER): Payer: Self-pay

## 2021-10-30 DIAGNOSIS — N924 Excessive bleeding in the premenopausal period: Secondary | ICD-10-CM

## 2021-10-30 DIAGNOSIS — Z780 Asymptomatic menopausal state: Secondary | ICD-10-CM | POA: Diagnosis not present

## 2021-10-30 DIAGNOSIS — Z1231 Encounter for screening mammogram for malignant neoplasm of breast: Secondary | ICD-10-CM | POA: Diagnosis present

## 2021-10-30 DIAGNOSIS — M8588 Other specified disorders of bone density and structure, other site: Secondary | ICD-10-CM | POA: Insufficient documentation

## 2021-10-30 DIAGNOSIS — Z13820 Encounter for screening for osteoporosis: Secondary | ICD-10-CM | POA: Diagnosis present

## 2021-11-02 LAB — CYTOLOGY - PAP
Comment: NEGATIVE
Diagnosis: UNDETERMINED — AB
High risk HPV: NEGATIVE

## 2021-11-16 ENCOUNTER — Telehealth: Payer: Self-pay

## 2021-11-16 ENCOUNTER — Encounter: Payer: Self-pay | Admitting: Obstetrics & Gynecology

## 2021-11-16 DIAGNOSIS — M8580 Other specified disorders of bone density and structure, unspecified site: Secondary | ICD-10-CM | POA: Insufficient documentation

## 2021-11-16 NOTE — Telephone Encounter (Signed)
Patient called and made aware that ultrasound is normal.   Patient also made aware of osteopenia diagnosis. Patient states she will speak to [place] at her follow up on July 26th for more understanding but she does have a primary care doctor who she will let know about the osteopenia. Armandina Stammer RN

## 2021-11-16 NOTE — Telephone Encounter (Signed)
-----   Message from Willodean Rosenthal, MD sent at 11/16/2021  1:52 PM EDT ----- Please call pt. She has osteopenia 'low bone mass'.  She needs a referral to Dr. Noreene Filbert (Sports Med) for eval and treatment.  Her [place] was also WNL.   Thank you,   Clh-S

## 2021-11-22 ENCOUNTER — Ambulatory Visit: Payer: Medicare Other | Admitting: Obstetrics & Gynecology

## 2021-11-22 ENCOUNTER — Encounter: Payer: Self-pay | Admitting: Obstetrics & Gynecology

## 2021-11-22 VITALS — BP 126/69 | HR 67 | Wt 175.0 lb

## 2021-11-22 DIAGNOSIS — N950 Postmenopausal bleeding: Secondary | ICD-10-CM | POA: Diagnosis not present

## 2021-11-22 DIAGNOSIS — Z780 Asymptomatic menopausal state: Secondary | ICD-10-CM | POA: Diagnosis not present

## 2021-11-22 DIAGNOSIS — M8580 Other specified disorders of bone density and structure, unspecified site: Secondary | ICD-10-CM

## 2021-11-22 NOTE — Progress Notes (Signed)
History:  71 y.o. [id number] here today for f/u of PMPB, Dexa scan which showed osteopenia and mammogram results. She had questions when called about her resutls. Pt reports that she had a recent bout of Shingles on her right arm. It was very painful. At that time she stopped for VIt D and Ca++ but, was taking it prior to that. She denies new problems. She denies further PMPB.    The following portions of the patient's history were reviewed and updated as appropriate: allergies, current medications, past family history, past medical history, past social history, past surgical history and problem list.  Review of Systems:  Pertinent items are noted in HPI.    Objective:  Physical Exam Blood pressure 126/69, pulse 67, weight 175 lb (79.4 kg).  CONSTITUTIONAL: Well-developed, well-nourished female in no acute distress.  HENT:  Normocephalic, atraumatic EYES: Conjunctivae and EOM are normal. No scleral icterus.  NECK: Normal range of motion SKIN: Skin is warm and dry. No rash noted. Not diaphoretic.No pallor. NEUROLGIC: Alert and oriented to person, place, and time. Normal coordination.  Pelvic: not repeated.    Labs and Imaging MM 3D SCREEN BREAST BILATERAL  Result Date: 11/01/2021 CLINICAL DATA:  Screening. EXAM: DIGITAL SCREENING BILATERAL MAMMOGRAM WITH TOMOSYNTHESIS AND CAD TECHNIQUE: Bilateral screening digital craniocaudal and mediolateral oblique mammograms were obtained. Bilateral screening digital breast tomosynthesis was performed. The images were evaluated with computer-aided detection. COMPARISON:  Previous exam(s). ACR Breast Density Category b: There are scattered areas of fibroglandular density. FINDINGS: There are no findings suspicious for malignancy. IMPRESSION: No mammographic evidence of malignancy. A result letter of this screening mammogram will be mailed directly to the patient. RECOMMENDATION: Screening mammogram in one year. (Code:SM-B-01Y) BI-RADS CATEGORY  1: Negative.  Electronically Signed   By: Edwin Cap M.D.   On: 11/01/2021 09:44   US PELVIC COMPLETE WITH TRANSVAGINAL  Result Date: 10/30/2021 CLINICAL DATA:  Postmenopausal bleeding. EXAM: TRANSABDOMINAL AND TRANSVAGINAL ULTRASOUND OF PELVIS TECHNIQUE: Both transabdominal and transvaginal ultrasound examinations of the pelvis were performed. Transabdominal technique was performed for global imaging of the pelvis including uterus, ovaries, adnexal regions, and pelvic cul-de-sac. It was necessary to proceed with endovaginal exam following the transabdominal exam to visualize the endometrium. COMPARISON:  None Available. FINDINGS: Uterus Measurements: 7.5 x 3.8 x 3.2 cm = volume: 47.7 mL. No fibroids or other mass visualized. Endometrium Thickness: 3 mm.  No focal abnormality visualized. Right ovary Not visualized Left ovary Measurements: 3.9 x 3.5 x 3.2 cm = volume: 23 mL. There is a 3.2 x 2.9 x 2.6 cm simple cyst within the left ovary. Other findings No abnormal free fluid. IMPRESSION: Endometrium measures 3 mm In the setting of post-menopausal bleeding, this is consistent with a benign etiology such as endometrial atrophy. If bleeding remains unresponsive to hormonal or medical therapy, sonohysterogram should be considered for focal lesion work-up. (Ref: Radiological Reasoning: Algorithmic Workup of Abnormal Vaginal Bleeding with Endovaginal Sonography and Sonohysterography. AJR 2008; 737:T06-26) 3.2 cm left ovarian benign functional cyst. Recommend follow-up pelvic US in 3-6 months. Reference: Radiology 2019 Nov;293(2):359-371 Electronically Signed   By: Annia Belt M.D.   On: 10/30/2021 12:30   DG BONE DENSITY (DXA)  Result Date: 10/30/2021 EXAM: DUAL X-RAY ABSORPTIOMETRY (DXA) FOR BONE MINERAL DENSITY IMPRESSION: Kelda Azad HARRAWAY-SMITH Your patient Matraca Hunkins completed a BMD test on 10/30/2021 using the Lunar IDXA DXA System (analysis version: 16.SP2) manufactured by [employer]. The following  summarizes the results of our evaluation. [facility] PATIENT: Name: Otilia, Kareem Patient ID:  [id number] Birth Date: May 06, 1950 Height: 63.0 in. Gender: Female Measured: 10/30/2021 Weight: 177.4 lbs. Indications: Advanced Age, African-American, Early Menopause, Estrogen Deficiency, Height Loss, Parent hip Fx, Post Menopausal Fractures: Treatments: Calcium, Multivitamin, Vitamin D ASSESSMENT: The BMD measured at AP Spine L1-L4 is 0.920 g/cm2 with a T-score of -2.2. This patient is considered osteopenic according to World Health Organization [facility]) criteria. The scan quality is good. Site Region Measured Date Measured Age WHO YA BMD Classification T-score AP Spine L1-L4 10/30/2021 [age] Low Bone Mass -2.2 0.920 g/cm2 DualFemur Neck Left 10/30/2021 [age] years Normal -0.7 0.943 g/cm2 World Health Organization [facility]) criteria for post-menopausal, Caucasian Women: Normal        T-score at or above -1 SD Low Bone Mass T-score between -1 and -2.5 SD Osteoporosis  T-score at or below -2.5 SD RECOMMENDATION: 1. All patients should optimize calcium and vitamin D intake. 2. Consider FDA-approved medical therapies in postmenopausal women and men aged 37 years and older, based on the following: a. A hip or vertebral(clinical or morphometric) fracture. b. T-Score < -2.5 at the femoral neck or spine after appropriate evaluation to exclude secondary causes c. Low bone mass (T-score between -1.0 and -2.5 at the femoral neck or spine) and a 10 year probability of a hip fracture >3% or a 10 year probability of major osteoporosis-related fracture > 20% based on the [place]-adapted WHO algorithm d. Clinical judgement and/or patient preferences may indicate treatment for people with 10-year fracture probabilities above or below these levels FOLLOW-UP: Patients with diagnosis of osteoporosis or at high risk for fracture should have regular bone mineral density tests. For patients eligible for Medicare, routine testing is allowed once every 2 years.  The testing frequency can be increased to one year for patients who have rapidly progressing disease, those who are receiving or discontinuing medical therapy to restore bone mass, or have additional risk factors. I have reviewed this report and agree with the above findings. [facility] Patient: Tiney Rouge Referring Physician: Willodean Rosenthal Birth Date: 09/10/1950 [age] Patient ID: [id number] Height: 63.0 in. Weight: 177.4 lbs. Measured: 10/30/2021 9:31:54 AM (16 SP 4) Gender: Female Ethnicity: White Analyzed: 10/30/2021 9:32:32 AM (16 SP 4) FRAX* 10-year Probability of Fracture Based on femoral neck BMD: DualFemur (Left) Major Osteoporotic Fracture: 8.0% Hip Fracture:                0.7% Population:                  [place] (Caucasian) Risk Factors:                None *FRAX is a Armed forces logistics/support/administrative officer of the [employer] for Metabolic Bone Disease, a World Science writer (WHO) [employer]. ASSESSMENT: The probability of a major osteoporotic fracture is 8.0% within the next ten years. The probability of a hip fracture is 0.7% within the next ten years. Electronically Signed   By: Bary Richard M.D.   On: 10/30/2021 10:14    Assessment & Plan:  Diagnoses and all orders for this visit:  Osteopenia after menopause -     AMB referral to sports medicine  Post-menopausal bleeding  No further bleeding.   Rec f/u ASAP if bleeding returns.   F/u in 1 year for annual  Total face-to-face time with patient, review of chart, discussion with consultant and coordination of care was .    Samayah Novinger L. Harraway-Smith, M.D., [facility]

## 2021-11-30 ENCOUNTER — Encounter: Payer: Self-pay | Admitting: Family Medicine

## 2022-01-10 ENCOUNTER — Ambulatory Visit: Payer: Medicare Other | Admitting: Obstetrics & Gynecology

## 2022-02-28 IMAGING — MG MM DIGITAL SCREENING BILAT W/ TOMO AND CAD
6 of 12 series · 6 of 36 positions shown · non-contrast
Comparison: Previous exam(s).

CLINICAL DATA: Screening.

EXAM:
DIGITAL SCREENING BILATERAL MAMMOGRAM WITH TOMOSYNTHESIS AND CAD
TECHNIQUE: Bilateral screening digital craniocaudal and mediolateral oblique
mammograms were obtained. Bilateral screening digital breast
tomosynthesis was performed. The images were evaluated with
computer-aided detection.

[R CC synth-2D (1 of 2)]
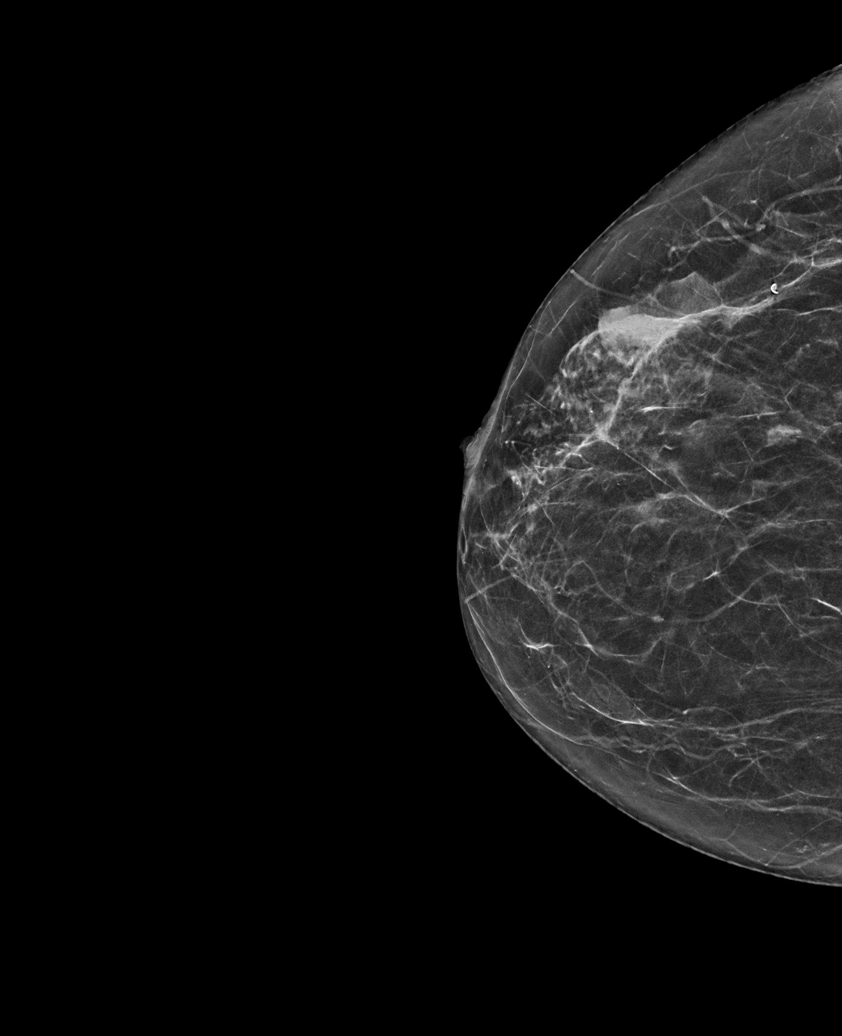

[L CC synth-2D]
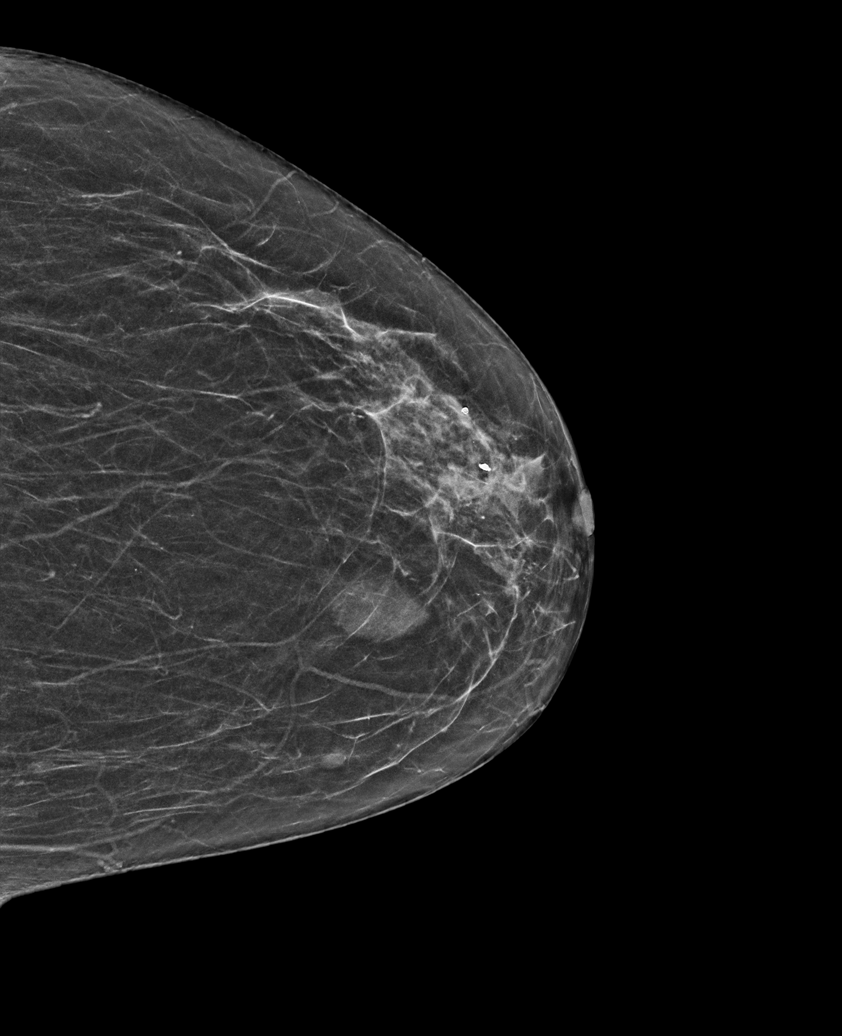

[R MLO synth-2D (1 of 2)]
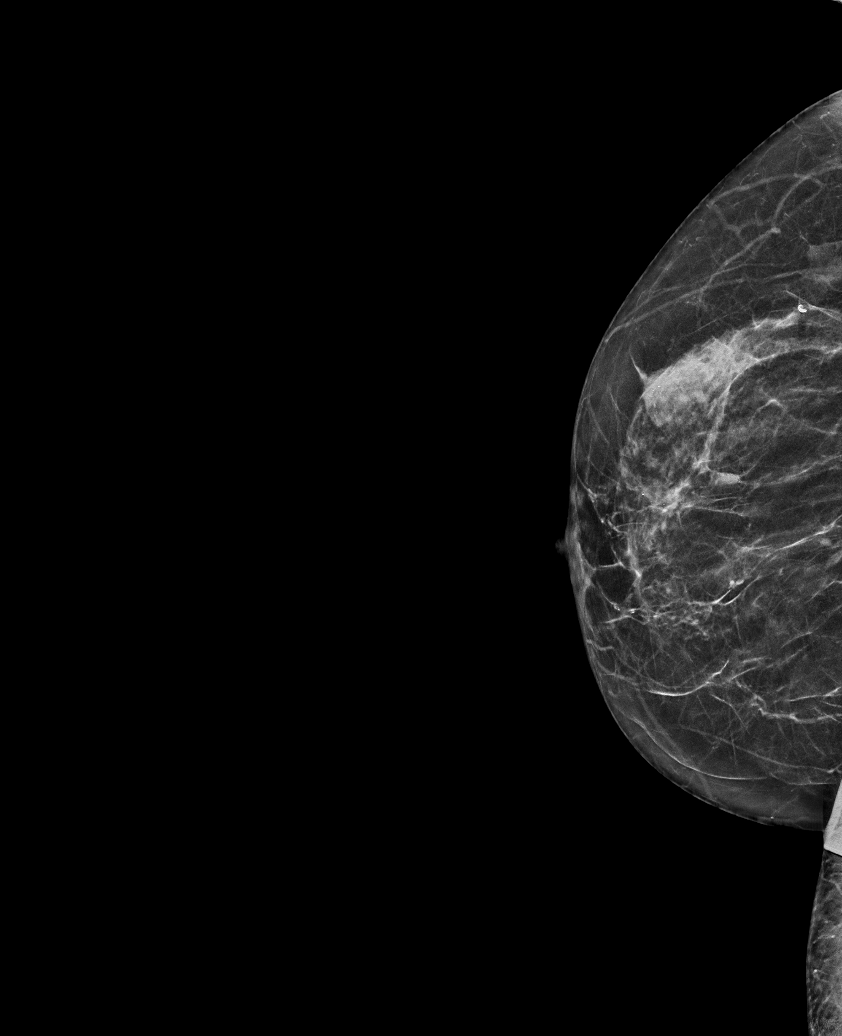

[R MLO synth-2D (2 of 2)]
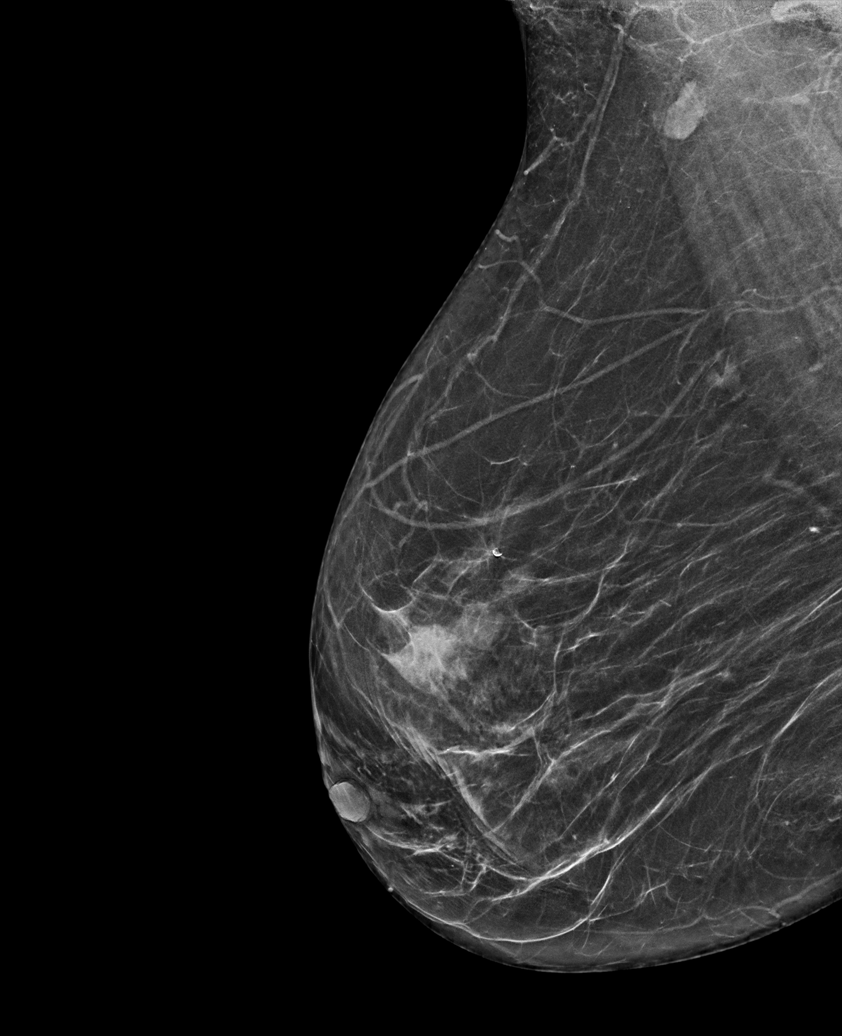

[L MLO synth-2D]
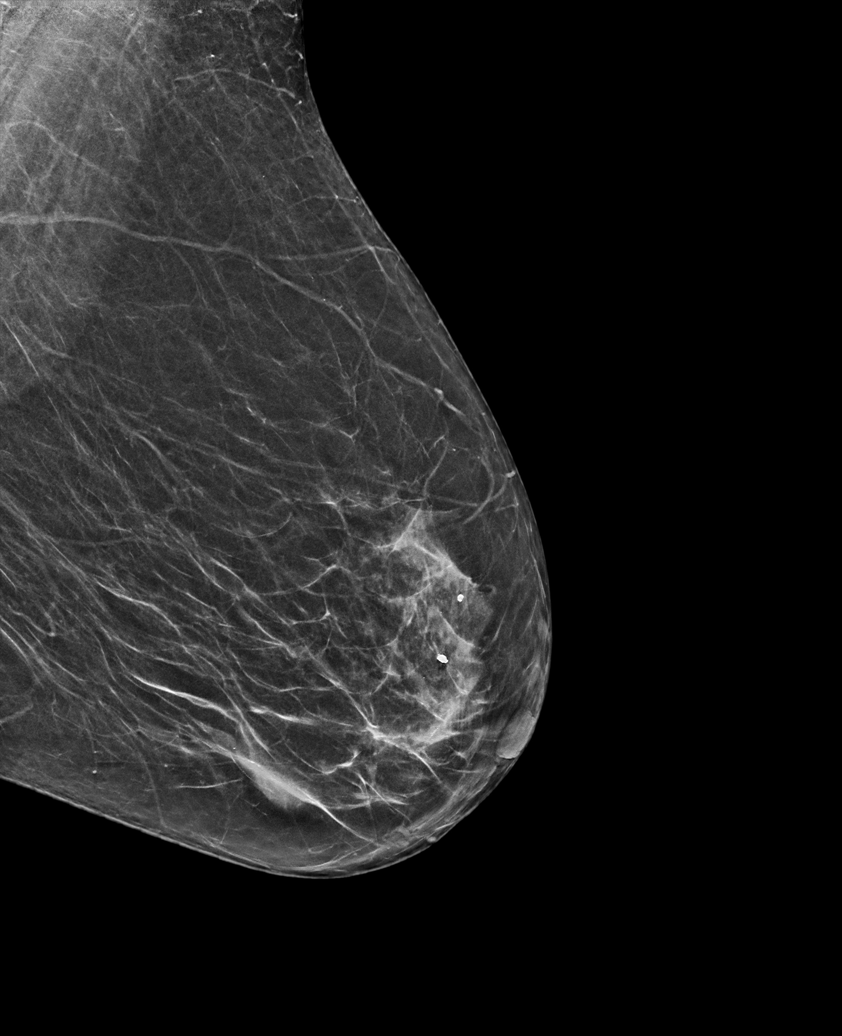

[R CC synth-2D (2 of 2)]
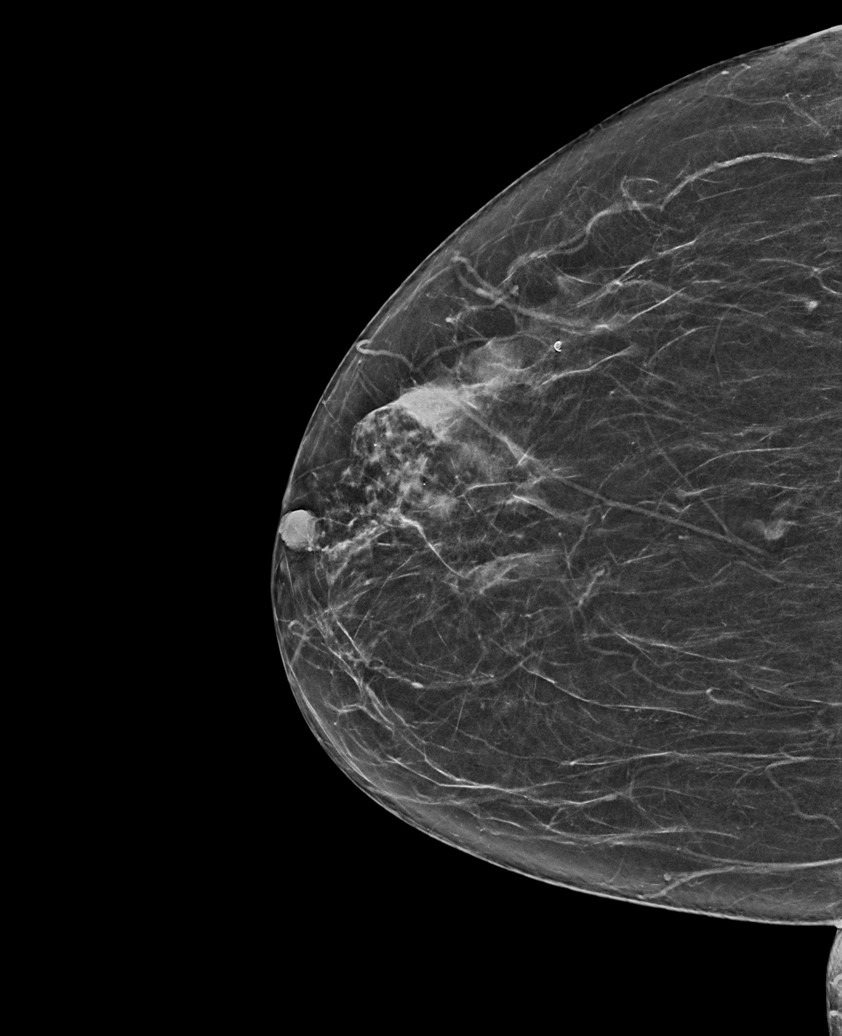

[6 of 36 positions shown; findings below may reference images not displayed]

ACR Breast Density Category b: There are scattered areas of
fibroglandular density.
FINDINGS: There are no findings suspicious for malignancy. The images were
evaluated with computer-aided detection.
IMPRESSION: No mammographic evidence of malignancy. A result letter of this
screening mammogram will be mailed directly to the patient.

RECOMMENDATION:
Screening mammogram in one year. (Code:WJ-I-BG6)

BI-RADS CATEGORY  1: Negative.

## 2022-09-05 ENCOUNTER — Ambulatory Visit: Payer: Medicare Other | Admitting: Obstetrics & Gynecology

## 2022-09-05 ENCOUNTER — Other Ambulatory Visit (HOSPITAL_COMMUNITY)
Admission: RE | Admit: 2022-09-05 | Discharge: 2022-09-05 | Disposition: A | Discharge status: Home / Self Care | Payer: Medicare Other | Source: Ambulatory Visit | Attending: Obstetrics & Gynecology | Admitting: Obstetrics & Gynecology

## 2022-09-05 ENCOUNTER — Encounter: Payer: Self-pay | Admitting: Obstetrics & Gynecology

## 2022-09-05 VITALS — BP 106/60 | HR 89 | Wt 166.0 lb

## 2022-09-05 DIAGNOSIS — N762 Acute vulvitis: Secondary | ICD-10-CM | POA: Diagnosis present

## 2022-09-05 DIAGNOSIS — B372 Candidiasis of skin and nail: Secondary | ICD-10-CM | POA: Diagnosis not present

## 2022-09-05 MED ORDER — NYSTATIN 100000 UNIT/GM EX CREA: 1.0000 | TOPICAL_CREAM | Freq: Three times a day (TID) | CUTANEOUS | 1 refills | Status: DC

## 2022-09-05 MED ORDER — TRIAMCINOLONE ACETONIDE 0.1 % EX CREA: 1.0000 | TOPICAL_CREAM | Freq: Three times a day (TID) | CUTANEOUS | 2 refills | Status: DC

## 2022-09-05 MED ORDER — FLUCONAZOLE 150 MG PO TABS: 150.0000 mg | ORAL_TABLET | ORAL | 3 refills | Status: DC

## 2022-09-05 NOTE — Progress Notes (Signed)
   GYNECOLOGY OFFICE VISIT NOTE  History:   Destiny Cannon is a 72 y.o. [id number] here today for evaluation of irritation of skin around her vulva and inner thighs for about a month.  Very itchy, especially at night.. She denies any abnormal vaginal discharge, bleeding, pelvic pain or other concerns.    Past Medical History:  Diagnosis Date   High cholesterol    Hypertension     Past Surgical History:  Procedure Laterality Date   right breast biopsy       The following portions of the patient's history were reviewed and updated as appropriate: allergies, current medications, past family history, past medical history, past social history, past surgical history and problem list.   Review of Systems:  Pertinent items noted in HPI and remainder of comprehensive ROS otherwise negative.  Physical Exam:  BP 106/60   Pulse 89   Wt 166 lb (75.3 kg)   BMI 29.41 kg/m  CONSTITUTIONAL: Well-developed, well-nourished female in no acute distress.  MUSCULOSKELETAL: Normal range of motion. No edema noted. NEUROLOGIC: Alert and oriented to person, place, and time. Normal muscle tone coordination. No cranial nerve deficit noted. PSYCHIATRIC: Normal mood and affect. Normal behavior. Normal judgment and thought content. CARDIOVASCULAR: Normal heart rate noted RESPIRATORY: Effort and breath sounds normal, no problems with respiration noted ABDOMEN: No masses noted. No other overt distention noted.   PELVIC: Diffuse excoriated and erythematous surface on junction of vulva and inner thighs bilaterally and bilateral labia majora surface.  Otherwise, normal appearing external genitalia; normal urethral meatus; normal appearing distal vaginal mucosa. Clear discharge noted, testing sample obtained.   Performed in the presence of a chaperone     Assessment and Plan:    1. Acute vulvitis Proper vulvar hygiene emphasized: discussed avoidance of perfumed soaps, detergents, lotions and any type of douches; in  addition to wearing cotton underwear and no underwear at night.  Also recommended cleaning front to back, voiding and cleaning up after intercourse.  - Cervicovaginal ancillary only( [place]) done, will follow up results and manage accordingly.  2. Skin yeast infection Wanted to prescribe Mycology, but had to prescribe individual components. Also prescribed Diflucan. Will monitor response. Told to call if symptoms do not resolve. - nystatin cream (MYCOSTATIN); Apply 1 Application topically 3 (three) times daily. Until symptoms resolve  Dispense: 60 g; Refill: 1 - triamcinolone cream (KENALOG) 0.1 %; Apply 1 Application topically 3 (three) times daily. Use until symptoms resolve  Dispense: 60 g; Refill: 2 - fluconazole (DIFLUCAN) 150 MG tablet; Take 1 tablet (150 mg total) by mouth every 3 (three) days. For three doses  Dispense: 3 tablet; Refill: 3  Return for any gynecologic concerns.    I spent 25 minutes dedicated to the care of this patient including pre-visit review of records, face to face time with the patient discussing her conditions and treatments and post visit orders.    Jaynie Collins, MD, FACOG Obstetrician & Gynecologist, [facility] for [employer], [facility]

## 2022-09-07 LAB — CERVICOVAGINAL ANCILLARY ONLY
Bacterial Vaginitis (gardnerella): NEGATIVE
Candida Glabrata: NEGATIVE
Candida Vaginitis: POSITIVE — AB
Comment: NEGATIVE
Comment: NEGATIVE
Comment: NEGATIVE
Comment: NEGATIVE
Trichomonas: NEGATIVE

## 2022-09-10 ENCOUNTER — Telehealth: Payer: Self-pay

## 2022-09-10 NOTE — Telephone Encounter (Signed)
Called patient to inform her that she tested positive for yeast. Diflucan was sent to her pharmacy. Understanding was voiced. Florabelle Cardin l Britanee Vanblarcom, CMA

## 2022-09-10 NOTE — Telephone Encounter (Signed)
Error

## 2023-02-26 ENCOUNTER — Other Ambulatory Visit (HOSPITAL_COMMUNITY)
Admission: RE | Admit: 2023-02-26 | Discharge: 2023-02-26 | Disposition: A | Discharge status: Home / Self Care | Payer: Medicare Other | Source: Ambulatory Visit | Attending: Family Medicine | Admitting: Family Medicine

## 2023-02-26 ENCOUNTER — Ambulatory Visit: Payer: Medicare Other

## 2023-02-26 VITALS — BP 137/71 | HR 67 | Wt 164.0 lb

## 2023-02-26 DIAGNOSIS — N898 Other specified noninflammatory disorders of vagina: Secondary | ICD-10-CM

## 2023-02-26 DIAGNOSIS — B372 Candidiasis of skin and nail: Secondary | ICD-10-CM

## 2023-02-26 MED ORDER — TRIAMCINOLONE ACETONIDE 0.1 % EX CREA: 1.0000 | TOPICAL_CREAM | Freq: Three times a day (TID) | CUTANEOUS | 2 refills | Status: DC

## 2023-02-26 MED ORDER — NYSTATIN 100000 UNIT/GM EX CREA: 1.0000 | TOPICAL_CREAM | Freq: Three times a day (TID) | CUTANEOUS | 1 refills | Status: DC

## 2023-02-26 MED ORDER — FLUCONAZOLE 150 MG PO TABS: 150.0000 mg | ORAL_TABLET | Freq: Once | ORAL | 0 refills | Status: AC

## 2023-02-26 NOTE — Progress Notes (Unsigned)
Pt reports itching around labia for about 2 weeks, states that she has been using creams that were previously prescribed with no relief. Denies vaginal discharge, odor, or itching on the inside of the vagina.

## 2023-02-27 DIAGNOSIS — B372 Candidiasis of skin and nail: Secondary | ICD-10-CM | POA: Insufficient documentation

## 2023-02-27 LAB — CERVICOVAGINAL ANCILLARY ONLY
Bacterial Vaginitis (gardnerella): NEGATIVE
Candida Glabrata: NEGATIVE
Candida Vaginitis: NEGATIVE
Comment: NEGATIVE
Comment: NEGATIVE
Comment: NEGATIVE

## 2023-02-27 NOTE — Progress Notes (Signed)
    SUBJECTIVE:   CHIEF COMPLAINT / HPI:   Vaginal itching  Patient is presenting for evaluation for vaginal itching around the outside of her vagina.  Denies itching on the inside or abnormal discharge.  Reports history of similar occurrence.  She reports she is scratching it worse at night when scratching so much that she is getting excoriations around her labia.  Has not seen any rash.  PERTINENT  PMH / PSH: History of skin yeast infection  OBJECTIVE:   BP 137/71   Pulse 67   Wt 164 lb (74.4 kg)   BMI 29.05 kg/m   General: Well-appearing 72 year old female Cardiac: Regular rate Respiratory: Normal for breathing, speaking in full sentences Abdomen: Soft, nontender GU: Exam performed with chaperone, no abnormal discharge wet prep collected, excoriations on labia bilaterally and inner thighs bilaterally  ASSESSMENT/PLAN:   Skin yeast infection Signs and symptoms consistent with skin yeast infection.  Was previously given nystatin and triamcinolone cream which she reports helped greatly with the itching.  Refill sent for both of these medications.  Strict return precautions given.  Wet prep collected to evaluate for vaginal yeast infection.  Will give 1 dose Diflucan.   Derrel Nip, MD Attending Family Medicine Physician, [facility] for [facility], [facility]

## 2023-02-27 NOTE — Assessment & Plan Note (Addendum)
Signs and symptoms consistent with skin yeast infection.  Was previously given nystatin and triamcinolone cream which she reports helped greatly with the itching.  Refill sent for both of these medications.  Strict return precautions given.  Wet prep collected to evaluate for vaginal yeast infection.  Will give 1 dose Diflucan.

## 2023-03-26 ENCOUNTER — Telehealth: Payer: Self-pay

## 2023-03-26 NOTE — Telephone Encounter (Signed)
Patient called stating that she had UTI symptoms. Explained to patient that our schedule is full for the holiday but I would discuss with our nurse.   Spoke with Philemon Kingdom, RN and we instructed the patient to call her PCP to get tested due to lack of appointment availability. Let patient know to give [place] a call if she needs anything else.

## 2023-11-06 ENCOUNTER — Other Ambulatory Visit (HOSPITAL_COMMUNITY)
Admission: RE | Admit: 2023-11-06 | Discharge: 2023-11-06 | Disposition: A | Discharge status: Home / Self Care | Source: Ambulatory Visit | Attending: Family Medicine | Admitting: Family Medicine

## 2023-11-06 ENCOUNTER — Ambulatory Visit: Admitting: Family Medicine

## 2023-11-06 VITALS — BP 133/74 | HR 78 | Wt 171.0 lb

## 2023-11-06 DIAGNOSIS — N950 Postmenopausal bleeding: Secondary | ICD-10-CM

## 2023-11-06 MED ORDER — NORETHINDRONE ACETATE 5 MG PO TABS: 2.5000 mg | ORAL_TABLET | Freq: Every day | ORAL | 3 refills | Status: DC

## 2023-11-06 NOTE — Progress Notes (Signed)
   Subjective:    Patient ID: Destiny Cannon, female    DOB: February 11, 1951, 73 y.o.   MRN: [id number]  HPI  Patient here with PMB that started Saturday and has persisted. Saturday and Sunday were heavier bleeding, now much lighter and most noticeable when she uses the bathroom. She had an episode of this previously, in 2023. She had an endometrial biopsy which showed atrophic endometrium and an US  which showed an endometrial stripe of 3mm.   Review of Systems     Objective:   Physical Exam Vitals reviewed. Exam conducted with a chaperone present.  Constitutional:      Appearance: Normal appearance.  Genitourinary:    Labia:        Right: No rash, tenderness or lesion.        Left: No rash, tenderness or lesion.      Vagina: No signs of injury. No vaginal discharge or tenderness.     Cervix: Normal. No cervical motion tenderness or friability.  Skin:    Capillary Refill: Capillary refill takes less than 2 seconds.  Neurological:     General: No focal deficit present.     Mental Status: She is alert.  Psychiatric:        Mood and Affect: Mood normal.        Behavior: Behavior normal.        Thought Content: Thought content normal.        Judgment: Judgment normal.     ENDOMETRIAL BIOPSY     The indications for endometrial biopsy were reviewed.   Risks of the biopsy including cramping, bleeding, infection, uterine perforation, inadequate specimen and need for additional procedures  were discussed. The patient states she understands and agrees to undergo procedure today. Consent was signed. Time out was performed. Urine HCG was negative. A sterile speculum was placed in the patient's vagina and the cervix was prepped with Betadine. A single-toothed tenaculum was placed on the anterior lip of the cervix to stabilize it. The 3 mm pipelle was introduced into the endometrial cavity without difficulty to a depth of 7 cm, and a moderate amount of tissue was obtained and sent to pathology.  The instruments were removed from the patient's vagina. Minimal bleeding from the cervix was noted. The patient tolerated the procedure well. Routine post-procedure instructions were given to the patient.     Assessment & Plan:  1. Post-menopausal bleeding (Primary) Endometrial biopsy done. At this point, repeating the ultrasound would not change management. Will place on aygestin  2.5mg  daily to prevent bleeding episode. F/u in 3 months - Surgical pathology( [place]/ [facility])

## 2023-11-07 LAB — SURGICAL PATHOLOGY

## 2023-11-12 ENCOUNTER — Ambulatory Visit: Payer: Self-pay | Admitting: Family Medicine

## 2023-11-27 ENCOUNTER — Encounter: Payer: Self-pay | Admitting: Family Medicine

## 2023-11-28 MED ORDER — NORETHINDRONE ACETATE 5 MG PO TABS: 5.0000 mg | ORAL_TABLET | Freq: Every day | ORAL | 3 refills | Status: DC

## 2023-12-10 NOTE — Telephone Encounter (Signed)
 Called patient to schedule follow up GYN appointment. Left voicemail for her call back.

## 2023-12-16 ENCOUNTER — Ambulatory Visit (INDEPENDENT_AMBULATORY_CARE_PROVIDER_SITE_OTHER): Admitting: Obstetrics and Gynecology

## 2023-12-16 VITALS — BP 138/55 | HR 74 | Wt 172.0 lb

## 2023-12-16 DIAGNOSIS — N949 Unspecified condition associated with female genital organs and menstrual cycle: Secondary | ICD-10-CM | POA: Diagnosis not present

## 2023-12-16 DIAGNOSIS — N950 Postmenopausal bleeding: Secondary | ICD-10-CM

## 2023-12-16 NOTE — Progress Notes (Signed)
   RETURN GYNECOLOGY VISIT  Subjective:  Gwendolyn Mclees is a 73 y.o. menopausal [id number] presenting for persistent PMB  Seen by [facility] 11/06/23 for 5 days of vaginal bleeding. EMB c/w atrophic endometrium. She was started on aygestin  2.5mg  daily. Continued to have bleeding so increased to 5mg  daily.   Today, reports continued daily bleeding. Is not saturating pads, but will have a quarter-sized spot of blood on the pad every time she uses the restroom. Notes some lower pelvic pain.   I personally reviewed the note from Dr. Dwyane on 11/06/23, path report from 11/06/23, and pelvic US  10/30/21 with normal uterus, EL 3mm, 3.2cm simple cyst within L ovary - radiologist recommended surveillance with repeat US  in 3-6 months  Objective:   Vitals:   12/16/23 0915  BP: (!) 138/55  Pulse: 74  Weight: 172 lb (78 kg)   General:  Alert, oriented and cooperative. Patient is in no acute distress.  Skin: Skin is warm and dry. No rash noted.   Cardiovascular: Normal heart rate noted  Respiratory: Normal respiratory effort, no problems with respiration noted   Assessment and Plan:  Marylou Golob is a 73 y.o. with   1. Postmenopausal bleeding (Primary) - Recommended hysteroscopy D&C for more thorough sampling and potential treatment esp if polyp has developed since her US  in 2023 - Risks of surgery include but are not limited to: bleeding, infection, uterine perforation with injury to bowel/bladder/blood vessels, fluid overload, electrolyte abnormalities, need for additional procedures, VTE, anesthesia reaction or medical complications like MI/CVA/death - We discussed postop restrictions, precautions and expectations - Preop testing per anesthesia - In interim, will stop aygestin  since it has not been effective. If heavy bleeding occurs can resume at 5-10mg  dose - All questions answered  - US  PELVIC COMPLETE WITH TRANSVAGINAL; Future - Ambulatory Referral For Surgery Scheduling  2. Adnexal cyst -  US  PELVIC COMPLETE WITH TRANSVAGINAL; Future  Future Appointments  Date Time Provider Department Center  01/08/2024 11:00 AM [facility]   Kieth JAYSON Carolin, MD

## 2024-01-08 ENCOUNTER — Ambulatory Visit (HOSPITAL_BASED_OUTPATIENT_CLINIC_OR_DEPARTMENT_OTHER)
Admission: RE | Admit: 2024-01-08 | Discharge: 2024-01-08 | Disposition: A | Discharge status: Home / Self Care | Source: Ambulatory Visit | Attending: Obstetrics and Gynecology | Admitting: Obstetrics and Gynecology

## 2024-01-08 DIAGNOSIS — N950 Postmenopausal bleeding: Secondary | ICD-10-CM | POA: Diagnosis present

## 2024-01-08 DIAGNOSIS — N949 Unspecified condition associated with female genital organs and menstrual cycle: Secondary | ICD-10-CM | POA: Insufficient documentation

## 2024-01-21 ENCOUNTER — Ambulatory Visit: Payer: Self-pay | Admitting: Obstetrics and Gynecology

## 2024-02-18 ENCOUNTER — Telehealth: Payer: Self-pay

## 2024-02-18 NOTE — Telephone Encounter (Signed)
 Patient called back and agreed to 03/03/24 surgery date w/ Dr. Erik at 10 am @[facility]. Surgery details and pre-op instructions were provided by phone.

## 2024-02-18 NOTE — Telephone Encounter (Signed)
 I called patient to see if she's available for surgery with Dr. Erik on 03/03/24. I left a detailed message and requested call me back to schedule.

## 2024-02-25 ENCOUNTER — Other Ambulatory Visit: Payer: Self-pay | Admitting: Obstetrics and Gynecology

## 2024-02-25 MED ORDER — MISOPROSTOL 200 MCG PO TABS
400.0000 ug | ORAL_TABLET | Freq: Once | ORAL | 0 refills | Status: DC
Start: 2024-02-25 — End: 2024-03-03

## 2024-02-25 NOTE — Progress Notes (Signed)
OR orders entered 

## 2024-02-27 ENCOUNTER — Encounter (HOSPITAL_COMMUNITY): Payer: Self-pay | Admitting: Obstetrics and Gynecology

## 2024-02-27 NOTE — Progress Notes (Signed)
 Spoke w/ via phone for pre-op interview--- Destiny Cannon and daughter Destiny Cannon needs dos---- BMP and EKG per anesthesia.        Cannon results------ COVID test -----patient states asymptomatic no test needed Arrive at -------0650 NPO after MN NO Solid Food.   Pre-Surgery Ensure or G2:  Med rec completed Medications to take morning of surgery -----NONE Diabetic medication -----  GLP1 agonist last dose: GLP1 instructions:  Patient instructed no nail polish to be worn day of surgery Patient instructed to bring photo id and insurance card day of surgery Patient aware to have Driver (ride ) / caregiver    for 24 hours after surgery - Daughter Destiny Cannon Patient Special Instructions ----- Pre-Op special Instructions -----  Patient verbalized understanding of instructions that were given at this phone interview. Patient denies chest pain, sob, fever, cough at the interview.

## 2024-03-03 ENCOUNTER — Encounter (HOSPITAL_COMMUNITY)
Admission: RE | Disposition: A | Discharge status: Home / Self Care | Payer: Self-pay | Source: Home / Self Care | Attending: Obstetrics and Gynecology

## 2024-03-03 ENCOUNTER — Encounter (HOSPITAL_COMMUNITY): Payer: Self-pay | Admitting: Obstetrics and Gynecology

## 2024-03-03 ENCOUNTER — Ambulatory Visit (HOSPITAL_COMMUNITY)
Admission: RE | Admit: 2024-03-03 | Discharge: 2024-03-03 | Disposition: A | Discharge status: Home / Self Care | Attending: Obstetrics and Gynecology | Admitting: Obstetrics and Gynecology

## 2024-03-03 ENCOUNTER — Ambulatory Visit (HOSPITAL_COMMUNITY): Admitting: Certified Registered Nurse Anesthetist

## 2024-03-03 DIAGNOSIS — N950 Postmenopausal bleeding: Secondary | ICD-10-CM | POA: Diagnosis not present

## 2024-03-03 DIAGNOSIS — N840 Polyp of corpus uteri: Secondary | ICD-10-CM

## 2024-03-03 DIAGNOSIS — Z79899 Other long term (current) drug therapy: Secondary | ICD-10-CM | POA: Diagnosis not present

## 2024-03-03 DIAGNOSIS — I10 Essential (primary) hypertension: Secondary | ICD-10-CM | POA: Diagnosis not present

## 2024-03-03 HISTORY — DX: Prediabetes: R73.03

## 2024-03-03 HISTORY — PX: HYSTEROSCOPY WITH D & C: SHX1775

## 2024-03-03 LAB — BASIC METABOLIC PANEL WITH GFR
Anion gap: 13 (ref 5–15)
BUN: 13 mg/dL (ref 8–23)
CO2: 26 mmol/L (ref 22–32)
Calcium: 9 mg/dL (ref 8.9–10.3)
Chloride: 103 mmol/L (ref 98–111)
Creatinine, Ser: 0.68 mg/dL (ref 0.44–1.00)
GFR, Estimated: >60 mL/min (ref >60)
Glucose, Bld: 122 mg/dL — ABNORMAL HIGH (ref 70–99)
Potassium: 3.3 mmol/L — ABNORMAL LOW (ref 3.5–5.1)
Sodium: 142 mmol/L (ref 135–145)

## 2024-03-03 SURGERY — DILATATION AND CURETTAGE /HYSTEROSCOPY
Anesthesia: General | Site: Uterus

## 2024-03-03 MED ORDER — ORAL CARE MOUTH RINSE: 15.0000 mL | Freq: Once | OROMUCOSAL | Status: AC

## 2024-03-03 MED ORDER — CHLORHEXIDINE GLUCONATE 0.12 % MT SOLN
15.0000 mL | Freq: Once | OROMUCOSAL | Status: AC
  Administered 2024-03-03: 15 mL via OROMUCOSAL

## 2024-03-03 MED ORDER — ONDANSETRON HCL 4 MG/2ML IJ SOLN
INTRAMUSCULAR | Status: AC
  Filled 2024-03-03: qty 2

## 2024-03-03 MED ORDER — FENTANYL CITRATE (PF) 100 MCG/2ML IJ SOLN: 25.0000 ug | INTRAMUSCULAR | Status: DC | PRN

## 2024-03-03 MED ORDER — LIDOCAINE 2% (20 MG/ML) 5 ML SYRINGE
INTRAMUSCULAR | Status: AC
  Filled 2024-03-03: qty 5

## 2024-03-03 MED ORDER — PROPOFOL 10 MG/ML IV BOLUS
INTRAVENOUS | Status: AC
  Filled 2024-03-03: qty 20

## 2024-03-03 MED ORDER — MIDAZOLAM HCL (PF) 2 MG/2ML IJ SOLN
INTRAMUSCULAR | Status: DC | PRN
  Administered 2024-03-03 (×2): 1 mg via INTRAVENOUS

## 2024-03-03 MED ORDER — OXYCODONE HCL 5 MG/5ML PO SOLN: 5.0000 mg | Freq: Once | ORAL | Status: DC | PRN

## 2024-03-03 MED ORDER — FENTANYL CITRATE (PF) 250 MCG/5ML IJ SOLN
INTRAMUSCULAR | Status: DC | PRN
  Administered 2024-03-03: 25 ug via INTRAVENOUS

## 2024-03-03 MED ORDER — FENTANYL CITRATE (PF) 100 MCG/2ML IJ SOLN
INTRAMUSCULAR | Status: AC
  Filled 2024-03-03: qty 2

## 2024-03-03 MED ORDER — LIDOCAINE 2% (20 MG/ML) 5 ML SYRINGE
INTRAMUSCULAR | Status: DC | PRN
  Administered 2024-03-03: 40 mg via INTRAVENOUS

## 2024-03-03 MED ORDER — MIDAZOLAM HCL 2 MG/2ML IJ SOLN
INTRAMUSCULAR | Status: AC
  Filled 2024-03-03: qty 2

## 2024-03-03 MED ORDER — OXYCODONE HCL 5 MG PO TABS: 5.0000 mg | ORAL_TABLET | Freq: Once | ORAL | Status: DC | PRN

## 2024-03-03 MED ORDER — DEXAMETHASONE SOD PHOSPHATE PF 10 MG/ML IJ SOLN
INTRAMUSCULAR | Status: DC | PRN
  Administered 2024-03-03: 5 mg via INTRAVENOUS

## 2024-03-03 MED ORDER — ONDANSETRON HCL 4 MG/2ML IJ SOLN
INTRAMUSCULAR | Status: DC | PRN
  Administered 2024-03-03: 4 mg via INTRAVENOUS

## 2024-03-03 MED ORDER — GLYCOPYRROLATE PF 0.2 MG/ML IJ SOSY
PREFILLED_SYRINGE | INTRAMUSCULAR | Status: DC | PRN
  Administered 2024-03-03: .2 mg via INTRAVENOUS

## 2024-03-03 MED ORDER — CHLORHEXIDINE GLUCONATE 0.12 % MT SOLN
OROMUCOSAL | Status: AC
  Filled 2024-03-03: qty 15

## 2024-03-03 MED ORDER — BUPIVACAINE HCL (PF) 0.5 % IJ SOLN
INTRAMUSCULAR | Status: AC
  Filled 2024-03-03: qty 120

## 2024-03-03 MED ORDER — SODIUM CHLORIDE 0.9 % IR SOLN
Status: DC | PRN
  Administered 2024-03-03 (×2): 3000 mL

## 2024-03-03 MED ORDER — LACTATED RINGERS IV SOLN: INTRAVENOUS | Status: DC

## 2024-03-03 MED ORDER — PROPOFOL 10 MG/ML IV BOLUS
INTRAVENOUS | Status: DC | PRN
  Administered 2024-03-03: 150 mg via INTRAVENOUS

## 2024-03-03 MED ORDER — ACETAMINOPHEN 10 MG/ML IV SOLN: 1000.0000 mg | Freq: Once | INTRAVENOUS | Status: DC | PRN

## 2024-03-03 MED ORDER — DROPERIDOL 2.5 MG/ML IJ SOLN: 0.6250 mg | Freq: Once | INTRAMUSCULAR | Status: DC | PRN

## 2024-03-03 SURGICAL SUPPLY — 12 items
CATH ROBINSON RED A/P 16FR (CATHETERS) ×1 IMPLANT
COVER MAYO STAND STRL (DRAPES) ×1 IMPLANT
CURETTE PIPELLE ENDOMTRL SUCTN (MISCELLANEOUS) IMPLANT
DEVICE MYOSURE REACH (MISCELLANEOUS) IMPLANT
GLOVE SURG UNDER POLY LF SZ7 (GLOVE) ×1 IMPLANT
GOWN STRL REUS W/ TWL LRG LVL3 (GOWN DISPOSABLE) ×1 IMPLANT
GOWN STRL REUS W/ TWL XL LVL3 (GOWN DISPOSABLE) ×1 IMPLANT
KIT PROCED FLUENT PRO FLT212S (KITS) ×1 IMPLANT
PACK VAGINAL MINOR WOMEN LF (CUSTOM PROCEDURE TRAY) ×1 IMPLANT
PAD OB MATERNITY 11 LF (PERSONAL CARE ITEMS) ×1 IMPLANT
SEAL ROD LENS SCOPE MYOSURE (ABLATOR) IMPLANT
TOWEL GREEN STERILE FF (TOWEL DISPOSABLE) ×2 IMPLANT

## 2024-03-03 NOTE — Discharge Instructions (Addendum)
 Post-surgical Instructions, Outpatient Surgery  You may expect to feel dizzy, weak, and drowsy for as long as 24 hours after receiving the medicine that made you sleep (anesthetic). For the first 24 hours after your surgery:   Do not drive a car, ride a bicycle, participate in physical activities, or take public transportation  Do not drink alcohol or take tranquilizers.  Do not take medicine that has not been prescribed by your physicians.  Do not sign important papers or make important decisions while on narcotic pain medicines.  Have a responsible person with you.   PAIN MANAGEMENT Ibuprofen 400-600mg .  (This is the same as 2-3 200mg  over the counter tablets of Motrin or ibuprofen.)  Take this every 6 hours as needed for cramping.   Acetaminophen 1000mg  (This is the same as 2-500mg  over the counter extra strength tylenol). Take this every 6 hours as needed for pain  DO'S AND DON'T'S Do not take a tub bath for 1 week. You may shower Do move around as you feel able.  Stairs are fine.  You may begin to exercise again as you feel able.  Do not put anything in the vagina for two weeks--no tampons, intercourse, or douching.    REGULAR MEDIATIONS/VITAMINS: You may restart all of your regular medications as prescribed. You may restart all of your vitamins as you normally take them.    PLEASE CALL OR SEEK MEDICAL CARE IF: You have persistent nausea and vomiting.  You have trouble eating or drinking.  You have an oral temperature above 100.5.  You have heavy vaginal bleeding    Post Anesthesia Home Care Instructions  Activity: Get plenty of rest for the remainder of the day. A responsible adult should stay with you for 24 hours following the procedure.  For the next 24 hours, DO NOT: -Drive a car -Advertising copywriter -Drink alcoholic beverages -Take any medication unless instructed by your physician -Make any legal decisions or sign important papers.  Meals: Start with liquid foods  such as gelatin or soup. Progress to regular foods as tolerated. Avoid greasy, spicy, heavy foods. If nausea and/or vomiting occur, drink only clear liquids until the nausea and/or vomiting subsides. Call your physician if vomiting continues.  Special Instructions/Symptoms: Your throat may feel dry or sore from the anesthesia or the breathing tube placed in your throat during surgery. If this causes discomfort, gargle with warm salt water. The discomfort should disappear within 24 hours.

## 2024-03-03 NOTE — H&P (Signed)
 PRE OPERATIVE HISTORY AND PHYSICAL   Subjective:  Destiny Cannon is a 73 y.o. menopausal [id number] presenting for scheduled hsc D&C for postmenopausal bleeding  PMB started July 2025. Benign EMB in office. Started on aygestin  but continued to have daily bleeding. Bleeding ultimately resolved after a few months. Stopped aygestin . Here for more complete sampling.  Pelvic US  10/30/21 - normal uterus, EL 3mm, 3.2cm simple cyst within L ovary Pelvic US  01/08/24 - 5 x 2.8 x 4.3cm uterus, EL 4.37mm, stable 3.6cm L ovarian cyst   Past Medical History:  Diagnosis Date   High cholesterol    Hypertension    Pre-diabetes    A1C 6.4 -09/2023   Past Surgical History:  Procedure Laterality Date   right breast biopsy      No current facility-administered medications on file prior to encounter.   Current Outpatient Medications on File Prior to Encounter  Medication Sig Dispense Refill   Ascorbic Acid (VITAMIN C WITH ROSE HIPS) 500 MG tablet Take 500 mg by mouth daily.     aspirin EC 81 MG tablet Take 81 mg by mouth daily. Swallow whole.     atorvastatin (LIPITOR) 10 MG tablet Take 10 mg by mouth daily.     cholecalciferol (VITAMIN D3) 25 MCG (1000 UNIT) tablet Take 1,000 Units by mouth daily.     lisinopril-hydrochlorothiazide (ZESTORETIC) 20-12.5 MG tablet Take 1 tablet by mouth daily.     cephALEXin  (KEFLEX ) 500 MG capsule Take 1 capsule (500 mg total) by mouth 4 (four) times daily. (Patient not taking: Reported on 10/25/2021) 28 capsule 0   norethindrone  (AYGESTIN ) 5 MG tablet Take 1 tablet (5 mg total) by mouth daily. (Patient not taking: Reported on 02/27/2024) 90 tablet 3   No Known Allergies Social History   Socioeconomic History   Marital status: Married    Spouse name: Not on file   Number of children: Not on file   Years of education: Not on file   Highest education level: Not on file  Occupational History   Not on file  Tobacco Use   Smoking status: Never   Smokeless tobacco:  Never  Substance and Sexual Activity   Alcohol use: Never   Drug use: Never   Sexual activity: Not on file  Other Topics Concern   Not on file  Social History Narrative   Not on file   Social Drivers of Health   Financial Resource Strain: Not on file  Food Insecurity: Low Risk  (07/10/2023)   Received from [facility]   Hunger Vital Sign    Within the past 12 months, you worried that your food would run out before you got money to buy more: Never true    Within the past 12 months, the food you bought just didn't last and you didn't have money to get more. : Never true  Transportation Needs: No Transportation Needs (07/10/2023)   Received from [facility]    In the past 12 months, has lack of reliable transportation kept you from medical appointments, meetings, work or from getting things needed for daily living? : No  Physical Activity: Not on file  Stress: Not on file  Social Connections: Not on file  Intimate Partner Violence: Not on file   Objective:   Vitals:   02/27/24 1225 03/03/24 0640  BP:  (!) 156/92  Pulse:  85  Resp:  17  Temp:  97.7 F (36.5 C)  TempSrc:  Oral  SpO2:  96%  Weight: 78 kg 77.1 kg  Height: 5' 2 (1.575 m) 5' 2 (1.575 m)   General:  Alert, oriented and cooperative. Patient is in no acute distress.  Skin: Skin is warm and dry. No rash noted.   Cardiovascular: Normal heart rate noted  Respiratory: Normal respiratory effort, no problems with respiration noted  Abdomen: Soft, non-tender, non-distended    Assessment and Plan:  Destiny Cannon is a 73 y.o. with PMB  - Plan for  hysteroscopy D&C for complete pathologic evaluation of persistent PMB - Risks of surgery include but are not limited to: bleeding, infection, uterine perforation with injury to bowel/bladder/blood vessels, fluid overload, electrolyte abnormalities, need for additional procedures, VTE, anesthesia reaction or medical complications like MI/CVA/death - We  discussed postop restrictions, precautions and expectations - All questions answered  - To OR when ready  Kieth JAYSON Carolin, MD

## 2024-03-03 NOTE — Anesthesia Preprocedure Evaluation (Addendum)
 Anesthesia Evaluation  Patient identified by MRN, date of birth, ID band Patient awake    Reviewed: Allergy & Precautions, NPO status , Patient's Chart, lab work & pertinent test results  Airway Mallampati: II  TM Distance: >3 FB Neck ROM: Full    Dental  (+) Teeth Intact, Dental Advisory Given   Pulmonary neg pulmonary ROS   breath sounds clear to auscultation       Cardiovascular hypertension, Pt. on medications  Rhythm:Regular Rate:Normal     Neuro/Psych negative neurological ROS  negative psych ROS   GI/Hepatic negative GI ROS, Neg liver ROS,,,  Endo/Other  negative endocrine ROS    Renal/GU negative Renal ROS     Musculoskeletal negative musculoskeletal ROS (+)    Abdominal   Peds  Hematology negative hematology ROS (+)   Anesthesia Other Findings   Reproductive/Obstetrics                              Anesthesia Physical Anesthesia Plan  ASA: 2  Anesthesia Plan: General   Post-op Pain Management: Tylenol PO (pre-op)*   Induction: Intravenous  PONV Risk Score and Plan: 4 or greater and Ondansetron, Dexamethasone and Treatment may vary due to age or medical condition  Airway Management Planned: LMA  Additional Equipment: None  Intra-op Plan:   Post-operative Plan: Extubation in OR  Informed Consent: I have reviewed the patients History and Physical, chart, labs and discussed the procedure including the risks, benefits and alternatives for the proposed anesthesia with the patient or authorized representative who has indicated his/her understanding and acceptance.     Dental advisory given  Plan Discussed with: CRNA  Anesthesia Plan Comments:          Anesthesia Quick Evaluation

## 2024-03-03 NOTE — Anesthesia Postprocedure Evaluation (Signed)
 Anesthesia Post Note  Patient: Destiny Cannon  Procedure(s) Performed: DILATATION AND CURETTAGE /HYSTEROSCOPY WITH MYOSURE (Uterus)     Patient location during evaluation: PACU Anesthesia Type: General Level of consciousness: awake and alert Pain management: pain level controlled Vital Signs Assessment: post-procedure vital signs reviewed and stable Respiratory status: spontaneous breathing, nonlabored ventilation, respiratory function stable and patient connected to nasal cannula oxygen Cardiovascular status: blood pressure returned to baseline and stable Postop Assessment: no apparent nausea or vomiting Anesthetic complications: no   No notable events documented.  Last Vitals:  Vitals:   03/03/24 1015 03/03/24 1050  BP: (!) 162/90 (!) 162/82  Pulse: 68 71  Resp: (!) 22   Temp:    SpO2: 92% 96%    Last Pain:  Vitals:   03/03/24 1050  TempSrc:   PainSc: 0-No pain                 Franky JONETTA Bald

## 2024-03-03 NOTE — Op Note (Signed)
 Preop: Postmenopausal bleeding Postop: Same, small endometrial polyp Procedure: Hysteroscopy D&C, polypectomy Surgeon: Dr. Kieth Carolin Assist: None EBL 5 cc Deficit 50cc Specimens: Endometrial curettings Findings: Small, mobile uterus. Thin atrophic appearing endometrium. Possible small polyp near the right tubal ostia.   Description of the procedure: Preop antibiotics not indicated. Informed consent reviewed and signed. Pt given opportunity to ask questions.   Pt prepped and draped in the dorsal lithotomy fashion after LMA anesthesia found to be adequate. Timeout performed.   A speculum was placed into the patient's vagina. Single tooth tenaculum applied to the 12 o'clock position of the cervix. Cervix progressively dilated to a 17 Fr. 30 degree hysteroscope was inserted into the cavity with the aforementioned findings noted. EMC performed. Several passes with an endometrial biopsy pipelle were used to collect any remaining tissue. Persistent small polyp present at the fundus. No polyp forceps available. Myosure reach used to remove the polyp and further sample the endometrium.   Hemostatic at the end of the procedure. Procedure completed. All instruments removed. Counts correct x2.  Pt taken to recovery room in stable condition.  Kieth Carolin, MD Obstetrician & Gynecologist, [facility] [employer], [facility]

## 2024-03-03 NOTE — Anesthesia Procedure Notes (Signed)
 Procedure Name: LMA Insertion Date/Time: 03/03/2024 9:01 AM  Performed by: Jolynn Mage, CRNAPre-anesthesia Checklist: Patient identified, Emergency Drugs available, Suction available and Patient being monitored Patient Re-evaluated:Patient Re-evaluated prior to induction Oxygen Delivery Method: Circle system utilized Preoxygenation: Pre-oxygenation with 100% oxygen Induction Type: IV induction Ventilation: Mask ventilation without difficulty LMA: LMA flexible inserted LMA Size: 4.0 Number of attempts: 1 Placement Confirmation: positive ETCO2 and breath sounds checked- equal and bilateral Tube secured with: Tape Dental Injury: Teeth and Oropharynx as per pre-operative assessment

## 2024-03-03 NOTE — Transfer of Care (Signed)
 Immediate Anesthesia Transfer of Care Note  Patient: Destiny Cannon  Procedure(s) Performed: DILATATION AND CURETTAGE /HYSTEROSCOPY WITH MYOSURE (Uterus)  Patient Location: PACU  Anesthesia Type:General  Level of Consciousness: awake, alert , patient cooperative, and responds to stimulation  Airway & Oxygen Therapy: Patient Spontanous Breathing and Patient connected to face mask oxygen  Post-op Assessment: Report given to RN and Post -op Vital signs reviewed and stable  Post vital signs: Reviewed and stable  Last Vitals:  Vitals Value Taken Time  BP 134/85 03/03/24 09:39  Temp 36.7 C 03/03/24 09:41  Pulse 84 03/03/24 09:43  Resp 19 03/03/24 09:43  SpO2 96 % 03/03/24 09:43  Vitals shown include unfiled device data.  Last Pain:  Vitals:   03/03/24 0640  TempSrc: Oral  PainSc: 0-No pain      Patients Stated Pain Goal: 5 (03/03/24 0640)  Complications: No notable events documented.

## 2024-03-04 ENCOUNTER — Encounter (HOSPITAL_COMMUNITY): Payer: Self-pay | Admitting: Obstetrics and Gynecology

## 2024-03-06 LAB — SURGICAL PATHOLOGY

## 2024-03-09 ENCOUNTER — Ambulatory Visit: Payer: Self-pay | Admitting: Obstetrics and Gynecology

## 2024-03-11 NOTE — Telephone Encounter (Signed)
-----   Message from Kieth JAYSON Carolin sent at 03/09/2024  6:03 PM EST ----- Attempted to call patient 11/10PM to review results, left voicemail. Please call patient and let her know her biopsy was benign - just a polyp. No follow up is needed unless she starts having bleeding again. Please do not send mychart message as she does not use it.  Thanks - KF ----- Message ----- From: Interface, [facility] Sent: 03/06/2024   6:00 AM EST To: Kieth JAYSON Carolin, MD

## 2024-03-11 NOTE — Telephone Encounter (Signed)
 Spoke with patient, made aware of results per Dr. Erik.  Erminio DELENA Rumps, RN
